# Patient Record
Sex: Female | Born: 1945 | Race: Black or African American | Hispanic: No | Smoking: Former smoker
Health system: Southern US, Community
[De-identification: ages and names within clinical notes are randomized; demographics above are authoritative.]

## PROBLEM LIST (undated history)

## (undated) DIAGNOSIS — K219 Gastro-esophageal reflux disease without esophagitis: Secondary | ICD-10-CM

## (undated) DIAGNOSIS — R0600 Dyspnea, unspecified: Secondary | ICD-10-CM

## (undated) DIAGNOSIS — I10 Essential (primary) hypertension: Secondary | ICD-10-CM

## (undated) DIAGNOSIS — J45909 Unspecified asthma, uncomplicated: Secondary | ICD-10-CM

## (undated) DIAGNOSIS — I2510 Atherosclerotic heart disease of native coronary artery without angina pectoris: Secondary | ICD-10-CM

## (undated) DIAGNOSIS — D509 Iron deficiency anemia, unspecified: Secondary | ICD-10-CM

## (undated) DIAGNOSIS — J449 Chronic obstructive pulmonary disease, unspecified: Secondary | ICD-10-CM

## (undated) DIAGNOSIS — M1990 Unspecified osteoarthritis, unspecified site: Secondary | ICD-10-CM

## (undated) DIAGNOSIS — G709 Myoneural disorder, unspecified: Secondary | ICD-10-CM

## (undated) DIAGNOSIS — E119 Type 2 diabetes mellitus without complications: Secondary | ICD-10-CM

## (undated) DIAGNOSIS — I509 Heart failure, unspecified: Secondary | ICD-10-CM

## (undated) DIAGNOSIS — K5900 Constipation, unspecified: Secondary | ICD-10-CM

## (undated) DIAGNOSIS — G4730 Sleep apnea, unspecified: Secondary | ICD-10-CM

## (undated) DIAGNOSIS — E7800 Pure hypercholesterolemia, unspecified: Secondary | ICD-10-CM

## (undated) DIAGNOSIS — D589 Hereditary hemolytic anemia, unspecified: Secondary | ICD-10-CM

## (undated) HISTORY — DX: Essential (primary) hypertension: I10

## (undated) HISTORY — DX: Pure hypercholesterolemia, unspecified: E78.00

## (undated) HISTORY — DX: Type 2 diabetes mellitus without complications: E11.9

## (undated) HISTORY — DX: Iron deficiency anemia, unspecified: D50.9

## (undated) HISTORY — PX: ECTOPIC PREGNANCY SURGERY: SHX613

## (undated) HISTORY — PX: PARTIAL HYSTERECTOMY: SHX80

## (undated) HISTORY — PX: CAROTID ENDARTERECTOMY: SUR193

## (undated) HISTORY — DX: Hereditary hemolytic anemia, unspecified: D58.9

## (undated) HISTORY — DX: Gastro-esophageal reflux disease without esophagitis: K21.9

## (undated) HISTORY — PX: OTHER SURGICAL HISTORY: SHX169

## (undated) HISTORY — DX: Sleep apnea, unspecified: G47.30

## (undated) HISTORY — DX: Constipation, unspecified: K59.00

## (undated) HISTORY — PX: BACK SURGERY: SHX140

---

## 2012-01-08 HISTORY — PX: COLONOSCOPY: SHX174

## 2014-07-05 ENCOUNTER — Encounter (INDEPENDENT_AMBULATORY_CARE_PROVIDER_SITE_OTHER): Payer: Self-pay | Admitting: *Deleted

## 2014-08-08 ENCOUNTER — Ambulatory Visit (INDEPENDENT_AMBULATORY_CARE_PROVIDER_SITE_OTHER): Payer: Self-pay | Admitting: Internal Medicine

## 2016-06-07 ENCOUNTER — Encounter: Payer: Self-pay | Admitting: Gastroenterology

## 2016-07-17 ENCOUNTER — Telehealth: Payer: Self-pay | Admitting: Gastroenterology

## 2016-07-17 ENCOUNTER — Ambulatory Visit (INDEPENDENT_AMBULATORY_CARE_PROVIDER_SITE_OTHER): Payer: Medicare HMO | Admitting: Gastroenterology

## 2016-07-17 ENCOUNTER — Encounter: Payer: Self-pay | Admitting: Gastroenterology

## 2016-07-17 ENCOUNTER — Other Ambulatory Visit: Payer: Self-pay

## 2016-07-17 DIAGNOSIS — D649 Anemia, unspecified: Secondary | ICD-10-CM

## 2016-07-17 MED ORDER — OMEPRAZOLE 20 MG PO CPDR: 20.0000 mg | DELAYED_RELEASE_CAPSULE | Freq: Every day | ORAL | 3 refills | Status: DC

## 2016-07-17 MED ORDER — NA SULFATE-K SULFATE-MG SULF 17.5-3.13-1.6 GM/177ML PO SOLN: 1.0000 | ORAL | 0 refills | Status: DC

## 2016-07-17 NOTE — Patient Instructions (Addendum)
I have sent Prilosec to the pharmacy to take once each morning, 30 minutes before breakfast.  We have scheduled you for a colonoscopy and upper endoscopy with Dr. Oneida Alar in the near future.  Please have blood work done tomorrow.

## 2016-07-17 NOTE — Telephone Encounter (Signed)
Noted  

## 2016-07-17 NOTE — Progress Notes (Addendum)
REVIEWED-NO ADDITIONAL RECOMMENDATIONS.  Primary Care Physician:  Keane Police, MD Primary Gastroenterologist:  Dr. Oneida Alar   Chief Complaint  Patient presents with  . Anemia    HPI:   Christine Bolton is a 71 y.o. female presenting today at the request of Dr. Junius Roads, hematologist, due to anemia and heme positive stool. From review of records, Hgb in March 2018 was 9, microcytic anemia. History of autoimmune hemolytic anemia and followed by Dr. Junius Roads. Received iron infusion in November 2017. She was heme positive recently.  Has bone marrow biopsy upcoming due to anemia.   Takes iron pill and states stool is always dark. Feels like hemorrhoids are an issue when having a BM. Feels constipated. Takes Linzess samples but unsure dosage. Chronic GERD but not on a PPI. Taking Zantac but no PPI. No NSAIDs. Aleve and Advil "makes me feel funny". Tramadol helps with joint pain/hip pain. No N/V. No unexplained weight loss. Comes off of antibiotics and cough resumes. Last iron infusion 3-4 months ago. States insurance won't pay for [employer].   Colonoscopy/EGD by Dr. Algis Greenhouse in 2014 but requesting records. Appears there is some concern for possible celiac disease in the remote past.   Past Medical History:  Diagnosis Date  . Constipation   . Diabetes ([place])   . GERD (gastroesophageal reflux disease)   . Hemolytic anemia ([place])   . HTN (hypertension)   . Hypercholesterolemia   . IDA (iron deficiency anemia)   . Sleep apnea     Past Surgical History:  Procedure Laterality Date  . BACK SURGERY    . CAROTID ENDARTERECTOMY    . COLONOSCOPY  2014   normal (?) she is not sure  . ECTOPIC PREGNANCY SURGERY    . fibroid tumor    . lower extremity bypass    . PARTIAL HYSTERECTOMY      Current Outpatient Prescriptions  Medication Sig Dispense Refill  . alendronate (FOSAMAX) 70 MG tablet   2  . amLODipine (NORVASC) 10 MG tablet Take 1 tablet by mouth daily.    Marland Kitchen aspirin 81 MG chewable tablet  Take 81 mg by mouth daily.      Marland Kitchen atorvastatin (LIPITOR) 40 MG tablet Take 1 tablet by mouth daily.    Marland Kitchen atorvastatin (LIPITOR) 40 MG tablet TK 1 T PO HS  3  . CARAFATE 1 GM/10ML suspension SHAKE LQ AND TK 10 ML PO BID QD AC AND HS  3  . carvedilol (COREG) 25 MG tablet Take 1 tablet by mouth 2 times daily (with meals).    . folic acid (FOLVITE) 1 MG tablet Take 1 tablet by mouth daily.    . INCRUSE ELLIPTA 62.5 MCG/INH AEPB INHALE 1 PUFF PO QD  3  . ipratropium (ATROVENT) 0.06 % nasal spray SPRAY 1 SPRAY IN BOTH NOSTRILS Q 4 HOURS PRN  2  . losartan (COZAAR) 50 MG tablet Take 1 tablet by mouth daily.    Marland Kitchen MAGNESIUM-OXIDE 400 (241.3 Mg) MG tablet TK 1 T PO BID  3  . metFORMIN (GLUCOPHAGE) 500 MG tablet Take 500 mg by mouth daily (with breakfast). Take with meal     . montelukast (SINGULAIR) 10 MG tablet TK 1 T PO QD IN THE EVE  5  . naproxen (NAPROSYN) 500 MG tablet TK 1 T PO Q 12 H X 10 DAYS  0  . predniSONE (DELTASONE) 20 MG tablet Take 1 tablet by mouth daily. 88m (3 tabs) daily. 4/12, 467m(2 tabs) one week. 4/19 20  mg (1 tab) one week. 4/26, 10mg (1/2 tab) one week    . spironolactone (ALDACTONE) 25 MG tablet TK 1 T PO  QD  2  . tiZANidine (ZANAFLEX) 2 MG tablet Take 2 mg by mouth every 6 hours as needed.      . traMADol (ULTRAM) 50 MG tablet Take 50 mg by mouth every 6 hours as needed.       No current facility-administered medications for this visit.     Allergies as of 07/17/2016 - Review Complete 07/17/2016  Allergen Reaction Noted  . Levofloxacin Other (See Comments) 04/12/2011    Family History  Problem Relation Age of Onset  . Colon cancer Neg Hx        no first degree relatives. States cousin had colon cancer  . Colon polyps Neg Hx     Social History   Social History  . Marital status: Divorced    Spouse name: N/A  . Number of children: N/A  . Years of education: N/A   Occupational History  . Not on file.   Social History Main Topics  . Smoking status: Never  Smoker  . Smokeless tobacco: Never Used  . Alcohol use No  . Drug use: No  . Sexual activity: Not on file   Other Topics Concern  . Not on file   Social History Narrative  . No narrative on file    Review of Systems: Gen: Denies any fever, chills, fatigue, weight loss, lack of appetite.  CV: Denies chest pain, heart palpitations, peripheral edema, syncope.  Resp: +DOE GI: see HPI  GU : Denies urinary burning, urinary frequency, urinary hesitancy MS: see HPI  Derm: Denies rash, itching, dry skin Psych: Denies depression, anxiety, memory loss, and confusion Heme: see HPI   Physical Exam: BP (!) 143/73   Pulse 67   Temp 98.4 F (36.9 C) (Oral)   Ht 5' 3" (1.6 m)   Wt 188 lb (85.3 kg)   BMI 33.30 kg/m  General:   Alert and oriented. Pleasant and cooperative. Well-nourished and well-developed.  Head:  Normocephalic and atraumatic. Eyes:  Without icterus, sclera clear and conjunctiva pink.  Ears:  Normal auditory acuity. Nose:  No deformity, discharge,  or lesions. Mouth:  No deformity or lesions, oral mucosa pink.  Lungs:  Clear to auscultation bilaterally. No wheezes, rales, or rhonchi. No distress.  Heart:  S1, S2 present  Abdomen:  +BS, soft, non-tender and non-distended. No HSM noted. No guarding or rebound. No masses appreciated.  Rectal:  Deferred  Msk:  Symmetrical without gross deformities. Normal posture. Extremities:  Without edema. Neurologic:  Alert and  oriented x4;  grossly normal neurologically. Psych:  Alert and cooperative. Normal mood and affect.   

## 2016-07-17 NOTE — Telephone Encounter (Signed)
Patient has [facility] that requires a referral and I called and spoke to Seth Bake at the PCP office and she stated they do them by fax and would have the approval to [place] tomorrow

## 2016-07-18 ENCOUNTER — Telehealth: Payer: Self-pay

## 2016-07-18 NOTE — Telephone Encounter (Signed)
Tried to call pt, LMOAM and informed her of pre-op appt 08/08/16 at 9:00am. Letter mailed.

## 2016-07-18 NOTE — Assessment & Plan Note (Signed)
71 year old female followed closely by Hematology with [facility] with history of autoimmune hemolytic anemia, IDA, receiving iron infusions several months ago, heme positive. No overt GI bleeding. Some question of celiac disease historically, but patient is unsure of this. Last colonoscopy/EGD in 2014 by Dr. Algis Greenhouse. Requesting these records. Hgb normally in 10 range per notes but drifting recently to the 9 range.  Check celiac serologies Proceed with colonoscopy/EGD with Dr. Oneida Alar in the near future. The risks, benefits, and alternatives have been discussed in detail with the patient. They state understanding and desire to proceed.  PROPOFOL due to polypharmacy Start Prilosec once daily due to GERD Continue avoidance of NSAIDs

## 2016-07-22 NOTE — Progress Notes (Signed)
CC'D TO PCP °

## 2016-07-25 NOTE — Telephone Encounter (Signed)
[employer] approval scanned

## 2016-08-05 NOTE — Patient Instructions (Signed)
Christine Bolton  08/05/2016     @PREFPERIOPPHARMACY @   Your procedure is scheduled on  08/13/2016   Report to [facility] at  745   A.M.  Call this number if you have problems the morning of surgery:  [phone]   Remember:  Do not eat food or drink liquids after midnight.  Take these medicines the morning of surgery with A SIP OF WATER  Amlodipine, coreg, cozaar, prilosec, zanaflex or ultram. Use your inhaler before you come. DO NOT take any medication for diabetes the morning of your procedure.   Do not wear jewelry, make-up or nail polish.  Do not wear lotions, powders, or perfumes, or deoderant.  Do not shave 48 hours prior to surgery.  Men may shave face and neck.  Do not bring valuables to the hospital.  [facility] is not responsible for any belongings or valuables.  Contacts, dentures or bridgework may not be worn into surgery.  Leave your suitcase in the car.  After surgery it may be brought to your room.  For patients admitted to the hospital, discharge time will be determined by your treatment team.  Patients discharged the day of surgery will not be allowed to drive home.   Name and phone number of your driver:   family Special instructions:  Follow the diet and prep instructions given to you by Dr Nona Dell office.  Please read over the following fact sheets that you were given. Anesthesia Post-op Instructions and Care and Recovery After Surgery       Esophagogastroduodenoscopy Esophagogastroduodenoscopy (EGD) is a procedure to examine the lining of the esophagus, stomach, and first part of the small intestine (duodenum). This procedure is done to check for problems such as inflammation, bleeding, ulcers, or growths. During this procedure, a long, flexible, lighted tube with a camera attached (endoscope) is inserted down the throat. Tell a health care provider about:  Any allergies you have.  All medicines you are taking, including  vitamins, herbs, eye drops, creams, and over-the-counter medicines.  Any problems you or family members have had with anesthetic medicines.  Any blood disorders you have.  Any surgeries you have had.  Any medical conditions you have.  Whether you are pregnant or may be pregnant. What are the risks? Generally, this is a safe procedure. However, problems may occur, including:  Infection.  Bleeding.  A tear (perforation) in the esophagus, stomach, or duodenum.  Trouble breathing.  Excessive sweating.  Spasms of the larynx.  A slowed heartbeat.  Low blood pressure.  What happens before the procedure?  Follow instructions from your health care provider about eating or drinking restrictions.  Ask your health care provider about: ? Changing or stopping your regular medicines. This is especially important if you are taking diabetes medicines or blood thinners. ? Taking medicines such as aspirin and ibuprofen. These medicines can thin your blood. Do not take these medicines before your procedure if your health care provider instructs you not to.  Plan to have someone take you home after the procedure.  If you wear dentures, be ready to remove them before the procedure. What happens during the procedure?  To reduce your risk of infection, your health care team will wash or sanitize their hands.  An IV tube will be put in a vein in your hand or arm. You will get medicines and fluids through this tube.  You will be given one  or more of the following: ? A medicine to help you relax (sedative). ? A medicine to numb the area (local anesthetic). This medicine may be sprayed into your throat. It will make you feel more comfortable and keep you from gagging or coughing during the procedure. ? A medicine for pain.  A mouth guard may be placed in your mouth to protect your teeth and to keep you from biting on the endoscope.  You will be asked to lie on your left side.  The  endoscope will be lowered down your throat into your esophagus, stomach, and duodenum.  Air will be put into the endoscope. This will help your health care provider see better.  The lining of your esophagus, stomach, and duodenum will be examined.  Your health care provider may: ? Take a tissue sample so it can be looked at in a lab (biopsy). ? Remove growths. ? Remove objects (foreign bodies) that are stuck. ? Treat any bleeding with medicines or other devices that stop tissue from bleeding. ? Widen (dilate) or stretch narrowed areas of your esophagus and stomach.  The endoscope will be taken out. The procedure may vary among health care providers and hospitals. What happens after the procedure?  Your blood pressure, heart rate, breathing rate, and blood oxygen level will be monitored often until the medicines you were given have worn off.  Do not eat or drink anything until the numbing medicine has worn off and your gag reflex has returned. This information is not intended to replace advice given to you by your health care provider. Make sure you discuss any questions you have with your health care provider. Document Released: 04/26/2004 Document Revised: 06/01/2015 Document Reviewed: 11/17/2014 Elsevier Interactive Patient Education  2018 [employer]. Esophagogastroduodenoscopy, Care After Refer to this sheet in the next few weeks. These instructions provide you with information about caring for yourself after your procedure. Your health care provider may also give you more specific instructions. Your treatment has been planned according to current medical practices, but problems sometimes occur. Call your health care provider if you have any problems or questions after your procedure. What can I expect after the procedure? After the procedure, it is common to have:  A sore throat.  Nausea.  Bloating.  Dizziness.  Fatigue.  Follow these instructions at home:  Do not eat  or drink anything until the numbing medicine (local anesthetic) has worn off and your gag reflex has returned. You will know that the local anesthetic has worn off when you can swallow comfortably.  Do not drive for 24 hours if you received a medicine to help you relax (sedative).  If your health care provider took a tissue sample for testing during the procedure, make sure to get your test results. This is your responsibility. Ask your health care provider or the department performing the test when your results will be ready.  Keep all follow-up visits as told by your health care provider. This is important. Contact a health care provider if:  You cannot stop coughing.  You are not urinating.  You are urinating less than usual. Get help right away if:  You have trouble swallowing.  You cannot eat or drink.  You have throat or chest pain that gets worse.  You are dizzy or light-headed.  You faint.  You have nausea or vomiting.  You have chills.  You have a fever.  You have severe abdominal pain.  You have black, tarry, or bloody stools.  This information is not intended to replace advice given to you by your health care provider. Make sure you discuss any questions you have with your health care provider. Document Released: 12/11/2011 Document Revised: 06/01/2015 Document Reviewed: 11/17/2014 Elsevier Interactive Patient Education  2018 [employer].  Colonoscopy, Adult A colonoscopy is an exam to look at the entire large intestine. During the exam, a lubricated, bendable tube is inserted into the anus and then passed into the rectum, colon, and other parts of the large intestine. A colonoscopy is often done as a part of normal colorectal screening or in response to certain symptoms, such as anemia, persistent diarrhea, abdominal pain, and blood in the stool. The exam can help screen for and diagnose medical problems, including:  Tumors.  Polyps.  Inflammation.  Areas  of bleeding.  Tell a health care provider about:  Any allergies you have.  All medicines you are taking, including vitamins, herbs, eye drops, creams, and over-the-counter medicines.  Any problems you or family members have had with anesthetic medicines.  Any blood disorders you have.  Any surgeries you have had.  Any medical conditions you have.  Any problems you have had passing stool. What are the risks? Generally, this is a safe procedure. However, problems may occur, including:  Bleeding.  A tear in the intestine.  A reaction to medicines given during the exam.  Infection (rare).  What happens before the procedure? Eating and drinking restrictions Follow instructions from your health care provider about eating and drinking, which may include:  A few days before the procedure - follow a low-fiber diet. Avoid nuts, seeds, dried fruit, raw fruits, and vegetables.  1-3 days before the procedure - follow a clear liquid diet. Drink only clear liquids, such as clear broth or bouillon, black coffee or tea, clear juice, clear soft drinks or sports drinks, gelatin dessert, and popsicles. Avoid any liquids that contain red or purple dye.  On the day of the procedure - do not eat or drink anything during the 2 hours before the procedure, or within the time period that your health care provider recommends.  Bowel prep If you were prescribed an oral bowel prep to clean out your colon:  Take it as told by your health care provider. Starting the day before your procedure, you will need to drink a large amount of medicated liquid. The liquid will cause you to have multiple loose stools until your stool is almost clear or light green.  If your skin or anus gets irritated from diarrhea, you may use these to relieve the irritation: ? Medicated wipes, such as adult wet wipes with aloe and vitamin E. ? A skin soothing-product like petroleum jelly.  If you vomit while drinking the bowel  prep, take a break for up to 60 minutes and then begin the bowel prep again. If vomiting continues and you cannot take the bowel prep without vomiting, call your health care provider.  General instructions  Ask your health care provider about changing or stopping your regular medicines. This is especially important if you are taking diabetes medicines or blood thinners.  Plan to have someone take you home from the hospital or clinic. What happens during the procedure?  An IV tube may be inserted into one of your veins.  You will be given medicine to help you relax (sedative).  To reduce your risk of infection: ? Your health care team will wash or sanitize their hands. ? Your anal area will be washed  with soap.  You will be asked to lie on your side with your knees bent.  Your health care provider will lubricate a long, thin, flexible tube. The tube will have a camera and a light on the end.  The tube will be inserted into your anus.  The tube will be gently eased through your rectum and colon.  Air will be delivered into your colon to keep it open. You may feel some pressure or cramping.  The camera will be used to take images during the procedure.  A small tissue sample may be removed from your body to be examined under a microscope (biopsy). If any potential problems are found, the tissue will be sent to a lab for testing.  If small polyps are found, your health care provider may remove them and have them checked for cancer cells.  The tube that was inserted into your anus will be slowly removed. The procedure may vary among health care providers and hospitals. What happens after the procedure?  Your blood pressure, heart rate, breathing rate, and blood oxygen level will be monitored until the medicines you were given have worn off.  Do not drive for 24 hours after the exam.  You may have a small amount of blood in your stool.  You may pass gas and have mild abdominal  cramping or bloating due to the air that was used to inflate your colon during the exam.  It is up to you to get the results of your procedure. Ask your health care provider, or the department performing the procedure, when your results will be ready. This information is not intended to replace advice given to you by your health care provider. Make sure you discuss any questions you have with your health care provider. Document Released: 12/22/1999 Document Revised: 10/25/2015 Document Reviewed: 03/07/2015 Elsevier Interactive Patient Education  2018 [employer].  Colonoscopy, Adult, Care After This sheet gives you information about how to care for yourself after your procedure. Your health care provider may also give you more specific instructions. If you have problems or questions, contact your health care provider. What can I expect after the procedure? After the procedure, it is common to have:  A small amount of blood in your stool for 24 hours after the procedure.  Some gas.  Mild abdominal cramping or bloating.  Follow these instructions at home: General instructions   For the first 24 hours after the procedure: ? Do not drive or use machinery. ? Do not sign important documents. ? Do not drink alcohol. ? Do your regular daily activities at a slower pace than normal. ? Eat soft, easy-to-digest foods. ? Rest often.  Take over-the-counter or prescription medicines only as told by your health care provider.  It is up to you to get the results of your procedure. Ask your health care provider, or the department performing the procedure, when your results will be ready. Relieving cramping and bloating  Try walking around when you have cramps or feel bloated.  Apply heat to your abdomen as told by your health care provider. Use a heat source that your health care provider recommends, such as a moist heat pack or a heating pad. ? Place a towel between your skin and the heat  source. ? Leave the heat on for 20-30 minutes. ? Remove the heat if your skin turns bright red. This is especially important if you are unable to feel pain, heat, or cold. You may have a greater  risk of getting burned. Eating and drinking  Drink enough fluid to keep your urine clear or pale yellow.  Resume your normal diet as instructed by your health care provider. Avoid heavy or fried foods that are hard to digest.  Avoid drinking alcohol for as long as instructed by your health care provider. Contact a health care provider if:  You have blood in your stool 2-3 days after the procedure. Get help right away if:  You have more than a small spotting of blood in your stool.  You pass large blood clots in your stool.  Your abdomen is swollen.  You have nausea or vomiting.  You have a fever.  You have increasing abdominal pain that is not relieved with medicine. This information is not intended to replace advice given to you by your health care provider. Make sure you discuss any questions you have with your health care provider. Document Released: 08/08/2003 Document Revised: 09/18/2015 Document Reviewed: 03/07/2015 Elsevier Interactive Patient Education  2018 [place] Anesthesia is a term that refers to techniques, procedures, and medicines that help a person stay safe and comfortable during a medical procedure. Monitored anesthesia care, or sedation, is one type of anesthesia. Your anesthesia specialist may recommend sedation if you will be having a procedure that does not require you to be unconscious, such as:  Cataract surgery.  A dental procedure.  A biopsy.  A colonoscopy.  During the procedure, you may receive a medicine to help you relax (sedative). There are three levels of sedation:  Mild sedation. At this level, you may feel awake and relaxed. You will be able to follow directions.  Moderate sedation. At this level, you will be  sleepy. You may not remember the procedure.  Deep sedation. At this level, you will be asleep. You will not remember the procedure.  The more medicine you are given, the deeper your level of sedation will be. Depending on how you respond to the procedure, the anesthesia specialist may change your level of sedation or the type of anesthesia to fit your needs. An anesthesia specialist will monitor you closely during the procedure. Let your health care provider know about:  Any allergies you have.  All medicines you are taking, including vitamins, herbs, eye drops, creams, and over-the-counter medicines.  Any use of steroids (by mouth or as a cream).  Any problems you or family members have had with sedatives and anesthetic medicines.  Any blood disorders you have.  Any surgeries you have had.  Any medical conditions you have, such as sleep apnea.  Whether you are pregnant or may be pregnant.  Any use of cigarettes, alcohol, or street drugs. What are the risks? Generally, this is a safe procedure. However, problems may occur, including:  Getting too much medicine (oversedation).  Nausea.  Allergic reaction to medicines.  Trouble breathing. If this happens, a breathing tube may be used to help with breathing. It will be removed when you are awake and breathing on your own.  Heart trouble.  Lung trouble.  Before the procedure Staying hydrated Follow instructions from your health care provider about hydration, which may include:  Up to 2 hours before the procedure - you may continue to drink clear liquids, such as water, clear fruit juice, black coffee, and plain tea.  Eating and drinking restrictions Follow instructions from your health care provider about eating and drinking, which may include:  8 hours before the procedure - stop eating heavy meals  or foods such as meat, fried foods, or fatty foods.  6 hours before the procedure - stop eating light meals or foods, such  as toast or cereal.  6 hours before the procedure - stop drinking milk or drinks that contain milk.  2 hours before the procedure - stop drinking clear liquids.  Medicines Ask your health care provider about:  Changing or stopping your regular medicines. This is especially important if you are taking diabetes medicines or blood thinners.  Taking medicines such as aspirin and ibuprofen. These medicines can thin your blood. Do not take these medicines before your procedure if your health care provider instructs you not to.  Tests and exams  You will have a physical exam.  You may have blood tests done to show: ? How well your kidneys and liver are working. ? How well your blood can clot.  General instructions  Plan to have someone take you home from the hospital or clinic.  If you will be going home right after the procedure, plan to have someone with you for 24 hours.  What happens during the procedure?  Your blood pressure, heart rate, breathing, level of pain and overall condition will be monitored.  An IV tube will be inserted into one of your veins.  Your anesthesia specialist will give you medicines as needed to keep you comfortable during the procedure. This may mean changing the level of sedation.  The procedure will be performed. After the procedure  Your blood pressure, heart rate, breathing rate, and blood oxygen level will be monitored until the medicines you were given have worn off.  Do not drive for 24 hours if you received a sedative.  You may: ? Feel sleepy, clumsy, or nauseous. ? Feel forgetful about what happened after the procedure. ? Have a sore throat if you had a breathing tube during the procedure. ? Vomit. This information is not intended to replace advice given to you by your health care provider. Make sure you discuss any questions you have with your health care provider. Document Released: 09/19/2004 Document Revised: 06/02/2015 Document  Reviewed: 04/16/2015 Elsevier Interactive Patient Education  2018 [place], Care After These instructions provide you with information about caring for yourself after your procedure. Your health care provider may also give you more specific instructions. Your treatment has been planned according to current medical practices, but problems sometimes occur. Call your health care provider if you have any problems or questions after your procedure. What can I expect after the procedure? After your procedure, it is common to:  Feel sleepy for several hours.  Feel clumsy and have poor balance for several hours.  Feel forgetful about what happened after the procedure.  Have poor judgment for several hours.  Feel nauseous or vomit.  Have a sore throat if you had a breathing tube during the procedure.  Follow these instructions at home: For at least 24 hours after the procedure:   Do not: ? Participate in activities in which you could fall or become injured. ? Drive. ? Use heavy machinery. ? Drink alcohol. ? Take sleeping pills or medicines that cause drowsiness. ? Make important decisions or sign legal documents. ? Take care of children on your own.  Rest. Eating and drinking  Follow the diet that is recommended by your health care provider.  If you vomit, drink water, juice, or soup when you can drink without vomiting.  Make sure you have little or no nausea before  eating solid foods. General instructions  Have a responsible adult stay with you until you are awake and alert.  Take over-the-counter and prescription medicines only as told by your health care provider.  If you smoke, do not smoke without supervision.  Keep all follow-up visits as told by your health care provider. This is important. Contact a health care provider if:  You keep feeling nauseous or you keep vomiting.  You feel light-headed.  You develop a rash.  You have a  fever. Get help right away if:  You have trouble breathing. This information is not intended to replace advice given to you by your health care provider. Make sure you discuss any questions you have with your health care provider. Document Released: 04/16/2015 Document Revised: 08/16/2015 Document Reviewed: 04/16/2015 Elsevier Interactive Patient Education  [employer].

## 2016-08-08 ENCOUNTER — Encounter (HOSPITAL_COMMUNITY): Payer: Self-pay

## 2016-08-08 ENCOUNTER — Encounter (HOSPITAL_COMMUNITY)
Admission: RE | Admit: 2016-08-08 | Discharge: 2016-08-08 | Disposition: A | Discharge status: Home / Self Care | Payer: Medicare HMO | Source: Ambulatory Visit | Attending: Gastroenterology | Admitting: Gastroenterology

## 2016-08-08 DIAGNOSIS — Z01818 Encounter for other preprocedural examination: Secondary | ICD-10-CM | POA: Diagnosis not present

## 2016-08-08 HISTORY — DX: Myoneural disorder, unspecified: G70.9

## 2016-08-08 HISTORY — DX: Dyspnea, unspecified: R06.00

## 2016-08-08 HISTORY — DX: Unspecified osteoarthritis, unspecified site: M19.90

## 2016-08-08 HISTORY — DX: Unspecified asthma, uncomplicated: J45.909

## 2016-08-08 LAB — CBC WITH DIFFERENTIAL/PLATELET
Basophils Absolute: 0 10*3/uL (ref 0.0–0.1)
Basophils Relative: 0 %
EOS PCT: 12 %
Eosinophils Absolute: 0.6 10*3/uL (ref 0.0–0.7)
HCT: 32.6 % — ABNORMAL LOW (ref 36.0–46.0)
HEMOGLOBIN: 9.7 g/dL — AB (ref 12.0–15.0)
LYMPHS ABS: 1 10*3/uL (ref 0.7–4.0)
LYMPHS PCT: 18 %
MCH: 23.7 pg — AB (ref 26.0–34.0)
MCHC: 29.8 g/dL — ABNORMAL LOW (ref 30.0–36.0)
MCV: 79.5 fL (ref 78.0–100.0)
Monocytes Absolute: 0.5 10*3/uL (ref 0.1–1.0)
Monocytes Relative: 10 %
Neutro Abs: 3.4 10*3/uL (ref 1.7–7.7)
Neutrophils Relative %: 60 %
PLATELETS: 200 10*3/uL (ref 150–400)
RBC: 4.1 MIL/uL (ref 3.87–5.11)
RDW: 19.5 % — ABNORMAL HIGH (ref 11.5–15.5)
WBC: 5.6 10*3/uL (ref 4.0–10.5)

## 2016-08-08 LAB — BASIC METABOLIC PANEL
Anion gap: 9 (ref 5–15)
BUN: 15 mg/dL (ref 6–20)
CHLORIDE: 102 mmol/L (ref 101–111)
CO2: 26 mmol/L (ref 22–32)
Calcium: 9.6 mg/dL (ref 8.9–10.3)
Creatinine, Ser: 0.88 mg/dL (ref 0.44–1.00)
GFR calc Af Amer: >60 mL/min (ref >60)
GFR calc non Af Amer: >60 mL/min (ref >60)
GLUCOSE: 119 mg/dL — AB (ref 65–99)
POTASSIUM: 4.3 mmol/L (ref 3.5–5.1)
Sodium: 137 mmol/L (ref 135–145)

## 2016-08-08 NOTE — Progress Notes (Signed)
To Dr. Fields. 

## 2016-08-08 NOTE — Progress Notes (Signed)
PT is aware. When does she recheck?

## 2016-08-08 NOTE — Progress Notes (Signed)
Per Dr. 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 She can see Hematologist with questions about following up on Hemoglobin.  I called and left Vm for pt and gave her the message and told her to call if she has questions.

## 2016-08-13 ENCOUNTER — Encounter (HOSPITAL_COMMUNITY)
Admission: RE | Disposition: A | Discharge status: Home / Self Care | Payer: Self-pay | Source: Ambulatory Visit | Attending: Gastroenterology

## 2016-08-13 ENCOUNTER — Ambulatory Visit (HOSPITAL_COMMUNITY): Payer: Medicare HMO | Admitting: Anesthesiology

## 2016-08-13 ENCOUNTER — Ambulatory Visit (HOSPITAL_COMMUNITY)
Admission: RE | Admit: 2016-08-13 | Discharge: 2016-08-13 | Disposition: A | Discharge status: Home / Self Care | Payer: Medicare HMO | Source: Ambulatory Visit | Attending: Gastroenterology | Admitting: Gastroenterology

## 2016-08-13 ENCOUNTER — Encounter (HOSPITAL_COMMUNITY): Payer: Self-pay | Admitting: Gastroenterology

## 2016-08-13 DIAGNOSIS — K2950 Unspecified chronic gastritis without bleeding: Secondary | ICD-10-CM | POA: Diagnosis not present

## 2016-08-13 DIAGNOSIS — Z7984 Long term (current) use of oral hypoglycemic drugs: Secondary | ICD-10-CM | POA: Diagnosis not present

## 2016-08-13 DIAGNOSIS — K2970 Gastritis, unspecified, without bleeding: Secondary | ICD-10-CM | POA: Diagnosis not present

## 2016-08-13 DIAGNOSIS — K644 Residual hemorrhoidal skin tags: Secondary | ICD-10-CM | POA: Insufficient documentation

## 2016-08-13 DIAGNOSIS — K648 Other hemorrhoids: Secondary | ICD-10-CM | POA: Insufficient documentation

## 2016-08-13 DIAGNOSIS — Z7982 Long term (current) use of aspirin: Secondary | ICD-10-CM | POA: Diagnosis not present

## 2016-08-13 DIAGNOSIS — Q438 Other specified congenital malformations of intestine: Secondary | ICD-10-CM | POA: Diagnosis not present

## 2016-08-13 DIAGNOSIS — K2980 Duodenitis without bleeding: Secondary | ICD-10-CM | POA: Insufficient documentation

## 2016-08-13 DIAGNOSIS — K219 Gastro-esophageal reflux disease without esophagitis: Secondary | ICD-10-CM | POA: Diagnosis not present

## 2016-08-13 DIAGNOSIS — E119 Type 2 diabetes mellitus without complications: Secondary | ICD-10-CM | POA: Diagnosis not present

## 2016-08-13 DIAGNOSIS — G4730 Sleep apnea, unspecified: Secondary | ICD-10-CM | POA: Diagnosis not present

## 2016-08-13 DIAGNOSIS — I10 Essential (primary) hypertension: Secondary | ICD-10-CM | POA: Diagnosis not present

## 2016-08-13 DIAGNOSIS — D649 Anemia, unspecified: Secondary | ICD-10-CM

## 2016-08-13 DIAGNOSIS — M1990 Unspecified osteoarthritis, unspecified site: Secondary | ICD-10-CM | POA: Diagnosis not present

## 2016-08-13 DIAGNOSIS — Z79899 Other long term (current) drug therapy: Secondary | ICD-10-CM | POA: Diagnosis not present

## 2016-08-13 DIAGNOSIS — D591 Other autoimmune hemolytic anemias: Secondary | ICD-10-CM | POA: Diagnosis present

## 2016-08-13 DIAGNOSIS — E7800 Pure hypercholesterolemia, unspecified: Secondary | ICD-10-CM | POA: Insufficient documentation

## 2016-08-13 DIAGNOSIS — K222 Esophageal obstruction: Secondary | ICD-10-CM | POA: Insufficient documentation

## 2016-08-13 DIAGNOSIS — K449 Diaphragmatic hernia without obstruction or gangrene: Secondary | ICD-10-CM | POA: Diagnosis not present

## 2016-08-13 DIAGNOSIS — Z881 Allergy status to other antibiotic agents status: Secondary | ICD-10-CM | POA: Insufficient documentation

## 2016-08-13 DIAGNOSIS — J45909 Unspecified asthma, uncomplicated: Secondary | ICD-10-CM | POA: Insufficient documentation

## 2016-08-13 DIAGNOSIS — D123 Benign neoplasm of transverse colon: Secondary | ICD-10-CM | POA: Diagnosis not present

## 2016-08-13 DIAGNOSIS — Z87891 Personal history of nicotine dependence: Secondary | ICD-10-CM | POA: Insufficient documentation

## 2016-08-13 DIAGNOSIS — K5730 Diverticulosis of large intestine without perforation or abscess without bleeding: Secondary | ICD-10-CM | POA: Diagnosis not present

## 2016-08-13 HISTORY — PX: ESOPHAGOGASTRODUODENOSCOPY (EGD) WITH PROPOFOL: SHX5813

## 2016-08-13 HISTORY — PX: BIOPSY: SHX5522

## 2016-08-13 HISTORY — PX: POLYPECTOMY: SHX5525

## 2016-08-13 HISTORY — PX: COLONOSCOPY WITH PROPOFOL: SHX5780

## 2016-08-13 LAB — GLUCOSE, CAPILLARY
Glucose-Capillary: 129 mg/dL — ABNORMAL HIGH (ref 65–99)
Glucose-Capillary: 117 mg/dL — ABNORMAL HIGH (ref 65–99)

## 2016-08-13 SURGERY — COLONOSCOPY WITH PROPOFOL
Anesthesia: Monitor Anesthesia Care

## 2016-08-13 MED ORDER — LIDOCAINE VISCOUS 2 % MT SOLN
OROMUCOSAL | Status: AC
  Filled 2016-08-13: qty 15

## 2016-08-13 MED ORDER — MIDAZOLAM HCL 2 MG/2ML IJ SOLN
1.0000 mg | INTRAMUSCULAR | Status: AC
  Administered 2016-08-13: 2 mg via INTRAVENOUS

## 2016-08-13 MED ORDER — LIDOCAINE VISCOUS 2 % MT SOLN
5.0000 mL | Freq: Once | OROMUCOSAL | Status: AC
  Administered 2016-08-13: 5 mL via OROMUCOSAL

## 2016-08-13 MED ORDER — MINERAL OIL PO OIL
TOPICAL_OIL | ORAL | Status: AC
  Filled 2016-08-13: qty 30

## 2016-08-13 MED ORDER — LIDOCAINE HCL 2 % EX GEL
CUTANEOUS | Status: AC
  Filled 2016-08-13: qty 30

## 2016-08-13 MED ORDER — LACTATED RINGERS IV SOLN
INTRAVENOUS | Status: DC
  Administered 2016-08-13: 08:00:00 via INTRAVENOUS

## 2016-08-13 MED ORDER — MIDAZOLAM HCL 2 MG/2ML IJ SOLN
INTRAMUSCULAR | Status: AC
  Filled 2016-08-13: qty 2

## 2016-08-13 MED ORDER — CHLORHEXIDINE GLUCONATE CLOTH 2 % EX PADS: 6.0000 | MEDICATED_PAD | Freq: Once | CUTANEOUS | Status: DC

## 2016-08-13 MED ORDER — PROPOFOL 10 MG/ML IV BOLUS
INTRAVENOUS | Status: AC
  Filled 2016-08-13: qty 40

## 2016-08-13 MED ORDER — LIDOCAINE HCL (CARDIAC) 10 MG/ML IV SOLN
INTRAVENOUS | Status: DC | PRN
  Administered 2016-08-13: 40 mg via INTRAVENOUS

## 2016-08-13 MED ORDER — FENTANYL CITRATE (PF) 100 MCG/2ML IJ SOLN
25.0000 ug | Freq: Once | INTRAMUSCULAR | Status: AC
  Administered 2016-08-13: 25 ug via INTRAVENOUS

## 2016-08-13 MED ORDER — PROPOFOL 500 MG/50ML IV EMUL
INTRAVENOUS | Status: DC | PRN
  Administered 2016-08-13: 10:00:00 via INTRAVENOUS
  Administered 2016-08-13: 150 ug/kg/min via INTRAVENOUS
  Administered 2016-08-13: 10:00:00 via INTRAVENOUS

## 2016-08-13 MED ORDER — FENTANYL CITRATE (PF) 100 MCG/2ML IJ SOLN
INTRAMUSCULAR | Status: AC
  Filled 2016-08-13: qty 2

## 2016-08-13 NOTE — Anesthesia Procedure Notes (Signed)
Procedure Name: MAC Date/Time: 08/13/2016 9:30 AM Performed by: Andree Elk, Karmine Kauer A Pre-anesthesia Checklist: Patient identified, Emergency Drugs available, Suction available, Patient being monitored and Timeout performed Oxygen Delivery Method: Simple face mask

## 2016-08-13 NOTE — Op Note (Signed)
[facility] Patient Name: Christine Bolton Procedure Date: 08/13/2016 9:28 AM MRN: [id number] Date of Birth: 1945-05-05 Attending MD: Barney Drain , MD CSN: [id number] Age: 71 Admit Type: Outpatient Procedure:                Colonoscopy WITH COLD SNARE POLYPECTOMY Indications:              Anemia. LAST pRBCs > 1 YR. LAST IVFE 2018. PMHx:                            AUTOIMMUNE HEMOLYTIC ANEMIA. NO BRBPR OR MELENA.                            LAST EGD/TCS 2014. Providers:                Barney Drain, MD, Jeanann Lewandowsky. Sharon Seller, RN, Aram Candela Referring MD:             Keane Police, MD Medicines:                Propofol per Anesthesia Complications:            No immediate complications. Estimated Blood Loss:     Estimated blood loss was minimal. Procedure:                Pre-Anesthesia Assessment:                           - Prior to the procedure, a History and Physical                            was performed, and patient medications and                            allergies were reviewed. The patient's tolerance of                            previous anesthesia was also reviewed. The risks                            and benefits of the procedure and the sedation                            options and risks were discussed with the patient.                            All questions were answered, and informed consent                            was obtained. Prior Anticoagulants: The patient has                            taken aspirin, last dose was 4 days prior to  procedure. ASA Grade Assessment: II - A patient                            with mild systemic disease. After reviewing the                            risks and benefits, the patient was deemed in                            satisfactory condition to undergo the procedure.                            After obtaining informed consent, the colonoscope    was passed under direct vision. Throughout the                            procedure, the patient's blood pressure, pulse, and                            oxygen saturations were monitored continuously. The                            EC-[id number] ([id number]) scope was introduced through                            the anus and advanced to the the cecum, identified                            by appendiceal orifice and ileocecal valve. The                            colonoscopy was technically difficult and complex                            due to significant looping. Successful completion                            of the procedure was aided by changing the patient                            to a supine position, straightening and shortening                            the scope to obtain bowel loop reduction and                            COLOWRAP. The ileocecal valve, appendiceal orifice,                            and rectum were photographed. The patient tolerated                            the procedure well. The quality of the bowel  preparation was excellent. Scope In: 9:48:42 AM Scope Out: 10:13:14 AM Scope Withdrawal Time: 0 hours 14 minutes 29 seconds  Total Procedure Duration: 0 hours 24 minutes 32 seconds  Findings:      A 4 mm polyp was found in the hepatic flexure. The polyp was sessile.       The polyp was removed with a cold snare. Resection and retrieval were       complete.      A few small and large-mouthed diverticula were found in the mid       ascending colon.      The recto-sigmoid colon, sigmoid colon and descending colon were       significantly redundant.      External and internal hemorrhoids were found during retroflexion. The       hemorrhoids were moderate. Impression:               - One 4 mm polyp at the hepatic flexure, removed                            with a cold snare.                           - MILD Diverticulosis in the mid  ascending colon.                           - EXTREMEMLY Redundant LEFT colon.                           - External and internal hemorrhoids.                           - NO SOURCE FOR ANEMIA IDENTIFIED. Moderate Sedation:      Per Anesthesia Care Recommendation:           - Repeat colonoscopy in 5-10 years for surveillance.                           - High fiber diet.                           - Continue present medications.                           - Await pathology results.                           - Return to my office in 4 months.                           - Patient has a contact number available for                            emergencies. The signs and symptoms of potential                            delayed complications were discussed with the  patient. Return to normal activities tomorrow.                            Written discharge instructions were provided to the                            patient. Procedure Code(s):        --- Professional ---                           [phone], Colonoscopy, flexible; with removal of                            tumor(s), polyp(s), or other lesion(s) by snare                            technique Diagnosis Code(s):        --- Professional ---                           D12.3, Benign neoplasm of transverse colon (hepatic                            flexure or splenic flexure)                           K64.8, Other hemorrhoids                           D64.9, Anemia, unspecified                           K57.30, Diverticulosis of large intestine without                            perforation or abscess without bleeding                           Q43.8, Other specified congenital malformations of                            intestine CPT copyright 2016 American Medical Association. All rights reserved. The codes documented in this report are preliminary and upon coder review may  be revised to meet current compliance  requirements. Barney Drain, MD Barney Drain, MD 08/13/2016 10:35:13 AM This report has been signed electronically. Number of Addenda: 0

## 2016-08-13 NOTE — H&P (Signed)
Primary Care Physician:  Keane Police, MD Primary Gastroenterologist:  Dr. Oneida Alar  Pre-Procedure History & Physical: HPI:  Christine Bolton is a 71 y.o. female here for PMHx: AUTOIMMUNE HEMOLYTIC ANEMIA. CONCERN FOR OBSCURE GIB DUE TO HB < 10.  Past Medical History:  Diagnosis Date  . Arthritis   . Asthma   . Constipation   . Diabetes ([place])   . Dyspnea   . GERD (gastroesophageal reflux disease)   . 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 HTN (hypertension)   . Hypercholesterolemia   . IDA (iron deficiency anemia)   . Neuromuscular disorder ([place])   . Sleep apnea     Past Surgical History:  Procedure Laterality Date  . BACK SURGERY    . CAROTID ENDARTERECTOMY    . COLONOSCOPY  2014   normal (?) she is not sure  . ECTOPIC PREGNANCY SURGERY    . fibroid tumor    . lower extremity bypass    . PARTIAL HYSTERECTOMY      Prior to Admission medications   Medication Sig Start Date End Date Taking? Authorizing Provider  Albuterol Sulfate 108 (90 Base) MCG/ACT AEPB Inhale 1 puff into the lungs daily as needed (wheezing).   Yes [provider]  alendronate (FOSAMAX) 70 MG tablet  05/11/16  Yes [provider]  amLODipine (NORVASC) 10 MG tablet Take 1 tablet by mouth daily. 04/16/11  Yes [provider]  aspirin 81 MG chewable tablet Take 81 mg by mouth daily.     Yes [provider]  atorvastatin (LIPITOR) 40 MG tablet Take 1 tablet by mouth daily. 04/17/11  Yes [provider]  Calcium Carbonate-Vitamin D (CALTRATE 600+D) 600-400 MG-UNIT tablet Take 1 tablet by mouth daily.   Yes [provider]  carvedilol (COREG) 6.25 MG tablet Take 6.25 mg by mouth 2 (two) times daily with a meal.   Yes [provider]  ferrous sulfate 325 (65 FE) MG EC tablet Take 325 mg by mouth 2 (two) times daily.   Yes [provider]  folic acid (FOLVITE) 1 MG tablet Take 1 tablet by mouth daily. 04/16/11  Yes [provider]  hydrOXYzine  (VISTARIL) 25 MG capsule Take 25 mg by mouth at bedtime.   Yes [provider]  INCRUSE ELLIPTA 62.5 MCG/INH AEPB INHALE 1 PUFF PO QD 05/01/16  Yes [provider]  ipratropium (ATROVENT) 0.06 % nasal spray SPRAY 1 SPRAY IN BOTH NOSTRILS Q 4 HOURS PRN 06/14/16  Yes [provider]  losartan (COZAAR) 100 MG tablet Take 100 mg by mouth daily.   Yes [provider]  MAGNESIUM-OXIDE 400 (241.3 Mg) MG tablet TK 1 T PO BID 06/27/16  Yes [provider]  metFORMIN (GLUCOPHAGE) 500 MG tablet Take 500 mg by mouth daily (with breakfast). Take with meal    Yes [provider]  montelukast (SINGULAIR) 10 MG tablet Take 1 tablet in the evening 06/24/16  Yes [provider]  naproxen (NAPROSYN) 500 MG tablet TK 1 T PO Q 12 H X 10 DAYS 04/22/16  Yes [provider]  olopatadine (PATANOL) 0.1 % ophthalmic solution Place 1 drop into both eyes at bedtime.   Yes [provider]  omeprazole (PRILOSEC) 20 MG capsule Take 1 capsule (20 mg total) by mouth daily. 30 minutes before breakfast 07/17/16  Yes Annitta Needs, NP  PRESCRIPTION MEDICATION Take 1 tablet by mouth daily. Domperidone 10 mg for a.   Yes [provider]  spironolactone (ALDACTONE)  25 MG tablet Take 1 tablet by mouth daily. 04/22/16  Yes [provider]  tiZANidine (ZANAFLEX) 4 MG tablet Take 4 mg by mouth 2 (two) times daily.   Yes [provider]  Topiramate ER (QUDEXY XR) 25 MG CS24 sprinkle cap Take 2 capsules by mouth at bedtime.   Yes [provider]  traMADol (ULTRAM) 50 MG tablet Take 50 mg by mouth every 6 hours as needed.     Yes [provider]  Na Sulfate-K Sulfate-Mg Sulf (SUPREP BOWEL PREP KIT) 17.5-3.13-1.6 GM/180ML SOLN Take 1 kit by mouth as directed. Patient not taking: Reported on 08/08/2016 07/17/16   Danie Binder, MD    Allergies as of 07/17/2016 - Review Complete 07/17/2016  Allergen Reaction Noted  . Levofloxacin  Other (See Comments) 04/12/2011    Family History  Problem Relation Age of Onset  . Colon cancer Neg Hx        no first degree relatives. States cousin had colon cancer  . Colon polyps Neg Hx     Social History   Social History  . Marital status: Divorced    Spouse name: N/A  . Number of children: N/A  . Years of education: N/A   Occupational History  . Not on file.   Social History Main Topics  . Smoking status: Former Smoker    Types: Cigarettes    Quit date: 2002  . Smokeless tobacco: Never Used  . Alcohol use No  . Drug use: No  . Sexual activity: Not on file   Other Topics Concern  . Not on file   Social History Narrative  . No narrative on file    Review of Systems: See HPI, otherwise negative ROS   Physical Exam: BP (!) 148/76   Pulse 78   Temp 98.2 F (36.8 C) (Oral)   Resp (!) 37   Ht _0  (1.6 m)   Wt 190 lb (86.2 kg)   SpO2 97%   BMI 33.66 kg/m  General:   Alert,  pleasant and cooperative in NAD Head:  Normocephalic and atraumatic. Neck:  Supple; Lungs:  Clear throughout to auscultation.    Heart:  Regular rate and rhythm. Abdomen:  Soft, nontender and nondistended. Normal bowel sounds, without guarding, and without rebound.   Neurologic:  Alert and  oriented x4;  grossly normal neurologically.  Impression/Plan:    Anemia  PLAN: EGD/TCS TODAY. DISCUSSED PROCEDURE, BENEFITS, & RISKS: < 1% chance of medication reaction, bleeding, perforation, or rupture of spleen/liver.

## 2016-08-13 NOTE — Discharge Instructions (Signed)
You had 1 polyp removed. You have internal and external hemorrhoids and diverticulosis in YOUR RIGHT COLON. 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 I biopsied your stomach, & small bowel.   DRINK WATER TO KEEP YOUR URINE LIGHT YELLOW.  FOLLOW A HIGH FIBER DIET. AVOID ITEMS THAT CAUSE BLOATING. SEE INFO BELOW.  YOUR BIOPSY RESULTS WILL BE AVAILABLE IN MY CHART AFTER  Aug 11 and MY OFFICE WILL CONTACT YOU IN 10-14 DAYS WITH YOUR RESULTS.   If NO SOURCE FOR your low blood count is IDENTIFIED, YOU WILL NEED THE CAMERA PILL.  FOLLOW UP IN 4 MOS.   Next colonoscopy in 5-10 years.   ENDOSCOPY Care After Read the instructions outlined below and refer to this sheet in the next week. These discharge instructions provide you with general information on caring for yourself after you leave the hospital. While your treatment has been planned according to the most current medical practices available, unavoidable complications occasionally occur. If you have any problems or questions after discharge, call DR. Derk Doubek, [phone].  ACTIVITY  You may resume your regular activity, but move at a slower pace for the next 24 hours.   Take frequent rest periods for the next 24 hours.   Walking will help get rid of the air and reduce the bloated feeling in your belly (abdomen).   No driving for 24 hours (because of the medicine (anesthesia) used during the test).   You may shower.   Do not sign any important legal documents or operate any machinery for 24 hours (because of the anesthesia used during the test).    NUTRITION  Drink plenty of fluids.   You may resume your normal diet as instructed by your doctor.   Begin with a light meal and progress to your normal diet. Heavy or fried foods are harder to digest and may make you feel sick to your stomach (nauseated).   Avoid alcoholic beverages for 24 hours  or as instructed.    MEDICATIONS  You may resume your normal medications.   WHAT YOU CAN EXPECT TODAY  Some feelings of bloating in the abdomen.   Passage of more gas than usual.   Spotting of blood in your stool or on the toilet paper  .  IF YOU HAD POLYPS REMOVED DURING THE ENDOSCOPY:  Eat a soft diet IF YOU HAVE NAUSEA, BLOATING, ABDOMINAL PAIN, OR VOMITING.    FINDING OUT THE RESULTS OF YOUR TEST Not all test results are available during your visit. DR. Oneida Alar WILL CALL YOU WITHIN 14 DAYS OF YOUR PROCEDUE WITH YOUR RESULTS. Do not assume everything is normal if you have not heard from DR. Chisom Muntean, CALL HER OFFICE AT [phone].  SEEK IMMEDIATE MEDICAL ATTENTION AND CALL THE OFFICE: [phone] IF:  You have more than a spotting of blood in your stool.   Your belly is swollen (abdominal distention).   You are nauseated or vomiting.   You have a temperature over 101F.   You have abdominal pain or discomfort that is severe or gets worse throughout the day.   Gastritis  Gastritis is an inflammation (the body's way of reacting to injury and/or infection) of the stomach. It is often caused by viral or bacterial (germ) infections. It can also be caused BY ASPIRIN, BC/GOODY POWDER'S, (IBUPROFEN) MOTRIN, OR ALEVE (NAPROXEN), chemicals (including alcohol), SPICY FOODS, and medications. This illness may be associated with generalized  malaise (feeling tired, not well), UPPER ABDOMINAL STOMACH cramps, and fever. One common bacterial cause of gastritis is an organism known as H. Pylori. This can be treated with antibiotics.    Hiatal Hernia A hiatal hernia occurs when a part of the stomach slides above the diaphragm. The diaphragm is the thin muscle separating the belly (abdomen) from the chest. A hiatal hernia can be something you are born with or develop over time. Hiatal hernias may allow stomach acid to flow back into your esophagus, the tube which carries food from your mouth  to your stomach. If this acid causes problems it is called GERD (gastro-esophageal reflux disease).   SYMPTOMS Common symptoms of GERD are heartburn (burning in your chest). This is worse when lying down or bending over. It may also cause belching and indigestion. Some of the things which make GERD worse are:  Increased weight pushes on stomach making acid rise more easily.   Smoking markedly increases acid production.   Alcohol decreases lower esophageal sphincter pressure (valve between stomach and esophagus), allowing acid from stomach into esophagus.   Late evening meals and going to bed with a full stomach increases pressure.   Anything that causes an increase in acid production.    HOME CARE INSTRUCTIONS  Try to achieve and maintain an ideal body weight.   Avoid drinking alcoholic beverages.   DO NOT smokE.   Do not wear tight clothing around your chest or stomach.   Eat smaller meals and eat more frequently. This keeps your stomach from getting too full. Eat slowly.   Do not lie down for 2 or 3 hours after eating. Do not eat or drink anything 1 to 2 hours before going to bed.   Avoid caffeine beverages (colas, coffee, cocoa, tea), fatty foods, citrus fruits and all other foods and drinks that contain acid and that seem to increase the problems.   Avoid bending over, especially after eating OR STRAINING. Anything that increases the pressure in your belly increases the amount of acid that may be pushed up into your esophagus.    High-Fiber Diet A high-fiber diet changes your normal diet to include more whole grains, legumes, fruits, and vegetables. Changes in the diet involve replacing refined carbohydrates with unrefined foods. The calorie level of the diet is essentially unchanged. The Dietary Reference Intake (recommended amount) for adult males is 38 grams per day. For adult females, it is 25 grams per day. Pregnant and lactating women should consume 28 grams of fiber per  day. Fiber is the intact part of a plant that is not broken down during digestion. Functional fiber is fiber that has been isolated from the plant to provide a beneficial effect in the body. PURPOSE  Increase stool bulk.   Ease and regulate bowel movements.   Lower cholesterol.  INDICATIONS THAT YOU NEED MORE FIBER  Constipation and hemorrhoids.   Uncomplicated diverticulosis (intestine condition) and irritable bowel syndrome.   Weight management.   As a protective measure against hardening of the arteries (atherosclerosis), diabetes, and cancer.   GUIDELINES FOR INCREASING FIBER IN THE DIET  Start adding fiber to the diet slowly. A gradual increase of about 5 more grams (2 slices of whole-wheat bread, 2 servings of most fruits or vegetables, or 1 bowl of high-fiber cereal) per day is best. Too rapid an increase in fiber may result in constipation, flatulence, and bloating.   Drink enough water and fluids to keep your urine clear or pale yellow. Water,  juice, or caffeine-free drinks are recommended. Not drinking enough fluid may cause constipation.   Eat a variety of high-fiber foods rather than one type of fiber.   Try to increase your intake of fiber through using high-fiber foods rather than fiber pills or supplements that contain small amounts of fiber.   The goal is to change the types of food eaten. Do not supplement your present diet with high-fiber foods, but replace foods in your present diet.  INCLUDE A VARIETY OF FIBER SOURCES  Replace refined and processed grains with whole grains, canned fruits with fresh fruits, and incorporate other fiber sources. White rice, white breads, and most bakery goods contain little or no fiber.   Brown whole-grain rice, buckwheat oats, and many fruits and vegetables are all good sources of fiber. These include: broccoli, Brussels sprouts, cabbage, cauliflower, beets, sweet potatoes, white potatoes (skin on), carrots, tomatoes, eggplant,  squash, berries, fresh fruits, and dried fruits.   Cereals appear to be the richest source of fiber. Cereal fiber is found in whole grains and bran. Bran is the fiber-rich outer coat of cereal grain, which is largely removed in refining. In whole-grain cereals, the bran remains. In breakfast cereals, the largest amount of fiber is found in those with "bran" in their names. The fiber content is sometimes indicated on the label.   You may need to include additional fruits and vegetables each day.   In baking, for 1 cup white flour, you may use the following substitutions:   1 cup whole-wheat flour minus 2 tablespoons.   1/2 cup white flour plus 1/2 cup whole-wheat flour.   Diverticulosis Diverticulosis is a common condition that develops when small pouches (diverticula) form in the wall of the colon. The risk of diverticulosis increases with age. It happens more often in people who eat a low-fiber diet. Most individuals with diverticulosis have no symptoms. Those individuals with symptoms usually experience belly (abdominal) pain, constipation, or loose stools (diarrhea).  HOME CARE INSTRUCTIONS  Increase the amount of fiber in your diet as directed by your caregiver or dietician. This may reduce symptoms of diverticulosis.   Drink at least 6 to 8 glasses of water each day to prevent constipation.   Try not to strain when you have a bowel movement.   Avoiding nuts and seeds to prevent complications is NOT NECESSARY.    FOODS HAVING HIGH FIBER CONTENT INCLUDE:  Fruits. Apple, peach, pear, tangerine, raisins, prunes.   Vegetables. Brussels sprouts, asparagus, broccoli, cabbage, carrot, cauliflower, romaine lettuce, spinach, summer squash, tomato, winter squash, zucchini.   Starchy Vegetables. Baked beans, kidney beans, lima beans, split peas, lentils, potatoes (with skin).   Grains. Whole wheat bread, brown rice, bran flake cereal, plain oatmeal, white rice, shredded wheat, bran muffins.     SEEK IMMEDIATE MEDICAL CARE IF:  You develop increasing pain or severe bloating.   You have an oral temperature above 101F.   You develop vomiting or bowel movements that are bloody or black.   Hemorrhoids Hemorrhoids are dilated (enlarged) veins around the rectum. Sometimes clots will form in the veins. This makes them swollen and painful. These are called thrombosed hemorrhoids. Causes of hemorrhoids include:  Constipation.   Straining to have a bowel movement.   HEAVY LIFTING HOME CARE INSTRUCTIONS  Eat a well balanced diet and drink 6 to 8 glasses of water every day to avoid constipation. You may also use a bulk laxative.   Avoid straining to have bowel movements.   Keep  anal area dry and clean.   Do not use a donut shaped pillow or sit on the toilet for long periods. This increases blood pooling and pain.   Move your bowels when your body has the urge; this will require less straining and will decrease pain and pressure.

## 2016-08-13 NOTE — Op Note (Signed)
[facility] Patient Name: Christine Bolton Procedure Date: 08/13/2016 10:16 AM MRN: [id number] Date of Birth: [birth year] Attending MD: Barney Drain , MD CSN: [id number] Age: 71 Admit Type: Outpatient Procedure:                Upper GI endoscopy WITH COLD FORCEP SBIOPSY Indications:              Anemia-USES ASA AND NAPROXEN. PMHx: AIHA. Providers:                Barney Drain, MD, Gwenlyn Fudge RN, RN, Aram Candela Referring MD:             Keane Police, MD Medicines:                Propofol per Anesthesia Complications:            No immediate complications. Estimated Blood Loss:     Estimated blood loss was minimal. Procedure:                Pre-Anesthesia Assessment:                           - Prior to the procedure, a History and Physical                            was performed, and patient medications and                            allergies were reviewed. The patient's tolerance of                            previous anesthesia was also reviewed. The risks                            and benefits of the procedure and the sedation                            options and risks were discussed with the patient.                            All questions were answered, and informed consent                            was obtained. Prior Anticoagulants: The patient has                            taken aspirin, last dose was 4 days prior to                            procedure. ASA Grade Assessment: II - A patient                            with mild systemic disease. After reviewing the                            risks and benefits, the patient was deemed in  satisfactory condition to undergo the procedure.                            After obtaining informed consent, the endoscope was                            passed under direct vision. Throughout the                            procedure, the patient's blood pressure, pulse, and   oxygen saturations were monitored continuously. The                            [id number] ([id number]) scope was introduced through the                            mouth, and advanced to the second part of duodenum.                            The upper GI endoscopy was accomplished without                            difficulty. The patient tolerated the procedure                            well. Scope In: 10:19:14 AM Scope Out: 10:28:33 AM Total Procedure Duration: 0 hours 9 minutes 19 seconds  Findings:      A widely patent Schatzki ring (acquired) was found at the       gastroesophageal junction.      Scattered mild inflammation characterized by congestion (edema) and       erythema was found in the entire examined stomach. Biopsies were taken       with a cold forceps for Helicobacter pylori testing OR ATROPHIC       GASTRITIS.      Patchy mild inflammation characterized by congestion (edema) and       erythema was found in the duodenal bulb. This was biopsied(3) with a       cold forceps for evaluation of celiac disease.      The examined duodenum was normal. Biopsies(4) for histology were taken       with a cold forceps for evaluation of celiac disease.      A small hiatal hernia was present. Impression:               - Widely patent Schatzki ring.                           - MID Gastritis/DUODENITIS. Biopsied.                           - SMALL HIATAL HERNIA                           - NO SOURCE FOR ANEMIA IDENTIFIED Moderate Sedation:      Per Anesthesia Care Recommendation:           - Await pathology results. WILL NEED GIVENS  CAPSULE                            IF NO SOURCE FOR ANEMIA IDENTIFIED.                           - High fiber diet.                           - Continue present medications.                           - Return to my office in 4 months.                           - Patient has a contact number available for                            emergencies. The signs and  symptoms of potential                            delayed complications were discussed with the                            patient. Return to normal activities tomorrow.                            Written discharge instructions were provided to the                            patient. Procedure Code(s):        --- Professional ---                           [phone], Esophagogastroduodenoscopy, flexible,                            transoral; with biopsy, single or multiple Diagnosis Code(s):        --- Professional ---                           K22.2, Esophageal obstruction                           K29.70, Gastritis, unspecified, without bleeding                           K29.80, Duodenitis without bleeding                           D64.9, Anemia, unspecified CPT copyright 2016 [employer]. All rights reserved. The codes documented in this report are preliminary and upon coder review may  be revised to meet current compliance requirements. Barney Drain, MD Barney Drain, MD 08/13/2016 10:41:38 AM This report has been signed electronically. Number of Addenda: 0

## 2016-08-13 NOTE — Transfer of Care (Signed)
Immediate Anesthesia Transfer of Care Note  Patient: Christine Bolton  Procedure(s) Performed: Procedure(s) with comments: COLONOSCOPY WITH PROPOFOL (N/A) - 9:15am ESOPHAGOGASTRODUODENOSCOPY (EGD) WITH PROPOFOL (N/A) POLYPECTOMY - colon  BIOPSY - gastric duodenal  Patient Location: PACU  Anesthesia Type:MAC  Level of Consciousness: awake, oriented and patient cooperative  Airway & Oxygen Therapy: Patient Spontanous Breathing and Patient connected to nasal cannula oxygen  Post-op Assessment: Report given to RN and Post -op Vital signs reviewed and stable  Post vital signs: Reviewed and stable  Last Vitals:  Vitals:   08/13/16 0925 08/13/16 0930  BP: (!) 148/64 137/62  Pulse:    Resp: (!) 21 (!) 25  Temp:      Last Pain:  Vitals:   08/13/16 0732  TempSrc: Oral      Patients Stated Pain Goal: 8 (03/54/65 6812)  Complications: No apparent anesthesia complications

## 2016-08-13 NOTE — Anesthesia Preprocedure Evaluation (Signed)
Anesthesia Evaluation  Patient identified by MRN, date of birth, ID band Patient awake    Reviewed: Allergy & Precautions, NPO status , Patient's Chart, lab work & pertinent test results  Airway Mallampati: II  TM Distance: >3 FB Neck ROM: Full    Dental  (+) Teeth Intact, Partial Upper   Pulmonary shortness of breath and with exertion, asthma , sleep apnea , former smoker,    breath sounds clear to auscultation       Cardiovascular hypertension, Pt. on medications  Rhythm:Regular Rate:Normal     Neuro/Psych    GI/Hepatic GERD  ,  Endo/Other  diabetes, Type 2  Renal/GU      Musculoskeletal  (+) Arthritis ,   Abdominal   Peds  Hematology  (+) Blood dyscrasia, anemia ,   Anesthesia Other Findings   Reproductive/Obstetrics                             Anesthesia Physical Anesthesia Plan  ASA: III  Anesthesia Plan: MAC   Post-op Pain Management:    Induction: Intravenous  PONV Risk Score and Plan:   Airway Management Planned: Simple Face Mask  Additional Equipment:   Intra-op Plan:   Post-operative Plan:   Informed Consent: I have reviewed the patients History and Physical, chart, labs and discussed the procedure including the risks, benefits and alternatives for the proposed anesthesia with the patient or authorized representative who has indicated his/her understanding and acceptance.     Plan Discussed with:   Anesthesia Plan Comments:         Anesthesia Quick Evaluation

## 2016-08-13 NOTE — Anesthesia Postprocedure Evaluation (Signed)
Anesthesia Post Note  Patient: Christine Bolton  Procedure(s) Performed: Procedure(s) (LRB): COLONOSCOPY WITH PROPOFOL (N/A) ESOPHAGOGASTRODUODENOSCOPY (EGD) WITH PROPOFOL (N/A) POLYPECTOMY BIOPSY  Patient location during evaluation: PACU Anesthesia Type: MAC Level of consciousness: awake and alert and oriented Pain management: pain level controlled Vital Signs Assessment: post-procedure vital signs reviewed and stable Respiratory status: spontaneous breathing and patient connected to nasal cannula oxygen Cardiovascular status: stable Postop Assessment: no signs of nausea or vomiting Anesthetic complications: no     Last Vitals:  Vitals:   08/13/16 0925 08/13/16 0930  BP: (!) 148/64 137/62  Pulse:    Resp: (!) 21 (!) 25  Temp:      Last Pain:  Vitals:   08/13/16 0732  TempSrc: Oral                 [name] A

## 2016-08-15 ENCOUNTER — Encounter (HOSPITAL_COMMUNITY): Payer: Self-pay | Admitting: Gastroenterology

## 2016-08-21 ENCOUNTER — Telehealth: Payer: Self-pay | Admitting: Gastroenterology

## 2016-08-21 NOTE — Telephone Encounter (Signed)
Reminder in epic °

## 2016-08-21 NOTE — Telephone Encounter (Signed)
Please call pt. HER stomach Bx shows mild gastritis AND NORMAL SMALL BOWEL BIOPSIES. She had ONE simple adenoma removed. NO SOURCE FOR HER low blood count WAS IDENTIFIED,  SHE NEEDS THE CAMERA PILL(GIVENS, Dx: OBSCURE GI BLEED/ANEMIA).   DRINK WATER TO KEEP YOUR URINE LIGHT YELLOW. FOLLOW A HIGH FIBER DIET. AVOID ITEMS THAT CAUSE BLOATING.  FOLLOW UP IN 4 MOS E30 DYSPHAGIA/ANEMIA.   Next colonoscopy in 5-10 years.

## 2016-08-21 NOTE — Telephone Encounter (Signed)
Tried to call with no answer  

## 2016-08-22 NOTE — Telephone Encounter (Signed)
LMOM to call.

## 2016-08-22 NOTE — Telephone Encounter (Signed)
Pt is out of town for a family emergency. She said that she would all back when she gets back. She is also asking about her mouth and teeth have been hurting since her EGD.

## 2016-08-23 NOTE — Telephone Encounter (Signed)
PLEASE CALL PT. IF HER MOUTH AND TEETH ARE HURTING SHE SHOULD SEE HER DENTIST. THE EGD SHOULD NOT MAKE HER MOUTH HURT 10 DAYS AFTER HER EGD.

## 2016-08-26 NOTE — Telephone Encounter (Signed)
LMOM to call.

## 2016-08-27 NOTE — Telephone Encounter (Signed)
Letter mailed to pt to call.  

## 2016-08-30 NOTE — Telephone Encounter (Signed)
PLEASE CALL PT. She should see her dentist for mouth/teeth. SHE OR HE CAN DO XRAYS IF NEEDED.

## 2016-08-30 NOTE — Telephone Encounter (Signed)
Pt called for results and was given that and she said OK to schedule the camera pill test.  She is aware that Dr. Oneida Alar said she should see her dentist because of her mouth, teeth pain, but is very concerned that she had never been bothered with that until after she had the upper endoscopy.  She was hoping Dr. Oneida Alar could order an Xray to just check on it since she had never been bothered before.   Forwarding to Dr. Oneida Alar and [facility] Clinical Staff to schedule the test.  Pt would like it to be scheduled after lunch time if possible.

## 2016-08-30 NOTE — Telephone Encounter (Signed)
Tried to call patient with no answer  

## 2016-08-30 NOTE — Telephone Encounter (Signed)
LMOM to call.

## 2016-09-02 NOTE — Telephone Encounter (Signed)
Pt is set up for GIVENS on 09/10/16 @ 7:00 am. NO PA is needed per the website

## 2016-09-03 NOTE — Telephone Encounter (Signed)
LMOM that she needs to see the dentist to see if she needs x-rays.

## 2016-09-10 ENCOUNTER — Encounter (HOSPITAL_COMMUNITY)
Admission: AD | Disposition: A | Discharge status: Home / Self Care | Payer: Self-pay | Source: Ambulatory Visit | Attending: Gastroenterology

## 2016-09-10 ENCOUNTER — Ambulatory Visit (HOSPITAL_COMMUNITY)
Admission: AD | Admit: 2016-09-10 | Discharge: 2016-09-10 | Disposition: A | Discharge status: Home / Self Care | Payer: Medicare HMO | Source: Ambulatory Visit | Attending: Gastroenterology | Admitting: Gastroenterology

## 2016-09-10 DIAGNOSIS — K5520 Angiodysplasia of colon without hemorrhage: Secondary | ICD-10-CM | POA: Diagnosis not present

## 2016-09-10 DIAGNOSIS — D500 Iron deficiency anemia secondary to blood loss (chronic): Secondary | ICD-10-CM | POA: Diagnosis present

## 2016-09-10 HISTORY — PX: GIVENS CAPSULE STUDY: SHX5432

## 2016-09-10 SURGERY — IMAGING PROCEDURE, GI TRACT, INTRALUMINAL, VIA CAPSULE

## 2016-09-10 MED ORDER — SIMETHICONE 40 MG/0.6ML PO SUSP
ORAL | Status: AC
  Filled 2016-09-10: qty 0.6

## 2016-09-10 NOTE — OR Nursing (Signed)
Patient showed up for a Givens capsule study and not scheduled in [facility]. Contacted Dr. Oneida Alar who said it is ok to proceed with procedure. Patient questioned if she had followed up with her dentist regarding her tooth pain and she said that she has not.

## 2016-09-12 ENCOUNTER — Encounter (HOSPITAL_COMMUNITY): Payer: Self-pay | Admitting: Gastroenterology

## 2016-09-20 ENCOUNTER — Telehealth: Payer: Self-pay | Admitting: Gastroenterology

## 2016-09-20 NOTE — Telephone Encounter (Signed)
LAST BLOOD TRANSFUSION WAS 5 YRS AGO. BEEN TAKING ORAL FOR PAST 2-3 YRS. SEES BLACK FORMED BUT STICKS TO TOILET(BLACK TARRY) NOT EVERY DAY. MAY NEED LINZESS  TO HAVE LINZESS. PT NEEDS ENTEROSCOPY WITH MAC. EXPLAINED PROCEDURE AND ANSWERED.    SEND A COPY OF THIS TC AND GIVENS REPORT TO DR. MADAN. NEXT OPV IN DEC 2018.

## 2016-09-20 NOTE — Procedures (Signed)
PATIENT DATA: WEIGHT: 177 LBS     WAIST:  43 IN    HEIGHT:  62 IN   GASTRIC PASSAGE TIME: 61 m, SB PASSAGE TIME: 4H 31m  RESULTS: LIMITED views of gastric mucosa due to retained contents.  SML AMOUNT OF BRIGHT RED BLOOD/PROBABLE AVM SEEN AT 1:27:13(DUODENUM-3% SB TIME). ADDITIONAL DISTAL SMALL BOWEL OCCASIONALLY OBSCURED BY RETAINED FOOD IN LUMEN. FREQUENT AVMs FROM 2:19:41(16% SB TIME) TO 7:53:12(98% SB TIME). LIMITED VIEWS OF THE COLON DUE TO RETAINED CONTENTS. No old blood or fresh blood in the stomach AND colon.  DIAGNOSIS: TRANSFUSION DEPENDENT ANEMIA DUE TO GI BLEED LOSS DUE TO SMALL BOWEL AVMs  Plan: 1. CONSIDER ENTEROSCOPY TO ABLATE AVMs IN PROXIMAL SMALL BOWEL.

## 2016-09-23 ENCOUNTER — Other Ambulatory Visit: Payer: Self-pay

## 2016-09-23 DIAGNOSIS — D649 Anemia, unspecified: Secondary | ICD-10-CM

## 2016-09-23 NOTE — Telephone Encounter (Signed)
Called pt. Enteroscopy w/Propofol with SLF scheduled for 10/15/16 at 9:15am. Will mail instructions after pre-op is scheduled.

## 2016-09-23 NOTE — Telephone Encounter (Signed)
Called and informed pt of pre-op appt 10/11/16 at 11:00am. Letter mailed with procedure instructions.

## 2016-09-23 NOTE — Telephone Encounter (Signed)
Noted  

## 2016-09-23 NOTE — Telephone Encounter (Signed)
Patient scheduled.

## 2016-10-08 NOTE — Patient Instructions (Signed)
Christine Bolton  10/08/2016     @PREFPERIOPPHARMACY @   Your procedure is scheduled on  10/15/2016   Report to [facility] at  740  A.M.  Call this number if you have problems the morning of surgery:  [phone]   Remember:  Do not eat food or drink liquids after midnight.  Take these medicines the morning of surgery with A SIP OF WATER  Amlodipine, coreg, cozaar, singulair, prilosec, zanaflex, ultram. Use your inhalers before you come. DO NOT take anything for your diabetes the morning of your procedure.   Do not wear jewelry, make-up or nail polish.  Do not wear lotions, powders, or perfumes, or deoderant.  Do not shave 48 hours prior to surgery.  Men may shave face and neck.  Do not bring valuables to the hospital.  [facility] is not responsible for any belongings or valuables.  Contacts, dentures or bridgework may not be worn into surgery.  Leave your suitcase in the car.  After surgery it may be brought to your room.  For patients admitted to the hospital, discharge time will be determined by your treatment team.  Patients discharged the day of surgery will not be allowed to drive home.   Name and phone number of your driver:   family Special instructions:  Follow the diet instructions given to you by Dr Nona Dell office.  Please read over the following fact sheets that you were given. Anesthesia Post-op Instructions and Care and Recovery After Surgery       Esophagogastroduodenoscopy Esophagogastroduodenoscopy (EGD) is a procedure to examine the lining of the esophagus, stomach, and first part of the small intestine (duodenum). This procedure is done to check for problems such as inflammation, bleeding, ulcers, or growths. During this procedure, a long, flexible, lighted tube with a camera attached (endoscope) is inserted down the throat. Tell a health care provider about:  Any allergies you have.  All medicines you are taking, including vitamins,  herbs, eye drops, creams, and over-the-counter medicines.  Any problems you or family members have had with anesthetic medicines.  Any blood disorders you have.  Any surgeries you have had.  Any medical conditions you have.  Whether you are pregnant or may be pregnant. What are the risks? Generally, this is a safe procedure. However, problems may occur, including:  Infection.  Bleeding.  A tear (perforation) in the esophagus, stomach, or duodenum.  Trouble breathing.  Excessive sweating.  Spasms of the larynx.  A slowed heartbeat.  Low blood pressure.  What happens before the procedure?  Follow instructions from your health care provider about eating or drinking restrictions.  Ask your health care provider about: ? Changing or stopping your regular medicines. This is especially important if you are taking diabetes medicines or blood thinners. ? Taking medicines such as aspirin and ibuprofen. These medicines can thin your blood. Do not take these medicines before your procedure if your health care provider instructs you not to.  Plan to have someone take you home after the procedure.  If you wear dentures, be ready to remove them before the procedure. What happens during the procedure?  To reduce your risk of infection, your health care team will wash or sanitize their hands.  An IV tube will be put in a vein in your hand or arm. You will get medicines and fluids through this tube.  You will be given one or more of the following: ? A  medicine to help you relax (sedative). ? A medicine to numb the area (local anesthetic). This medicine may be sprayed into your throat. It will make you feel more comfortable and keep you from gagging or coughing during the procedure. ? A medicine for pain.  A mouth guard may be placed in your mouth to protect your teeth and to keep you from biting on the endoscope.  You will be asked to lie on your left side.  The endoscope will be  lowered down your throat into your esophagus, stomach, and duodenum.  Air will be put into the endoscope. This will help your health care provider see better.  The lining of your esophagus, stomach, and duodenum will be examined.  Your health care provider may: ? Take a tissue sample so it can be looked at in a lab (biopsy). ? Remove growths. ? Remove objects (foreign bodies) that are stuck. ? Treat any bleeding with medicines or other devices that stop tissue from bleeding. ? Widen (dilate) or stretch narrowed areas of your esophagus and stomach.  The endoscope will be taken out. The procedure may vary among health care providers and hospitals. What happens after the procedure?  Your blood pressure, heart rate, breathing rate, and blood oxygen level will be monitored often until the medicines you were given have worn off.  Do not eat or drink anything until the numbing medicine has worn off and your gag reflex has returned. This information is not intended to replace advice given to you by your health care provider. Make sure you discuss any questions you have with your health care provider. Document Released: 04/26/2004 Document Revised: 06/01/2015 Document Reviewed: 11/17/2014 Elsevier Interactive Patient Education  2018 [employer]. Esophagogastroduodenoscopy, Care After Refer to this sheet in the next few weeks. These instructions provide you with information about caring for yourself after your procedure. Your health care provider may also give you more specific instructions. Your treatment has been planned according to current medical practices, but problems sometimes occur. Call your health care provider if you have any problems or questions after your procedure. What can I expect after the procedure? After the procedure, it is common to have:  A sore throat.  Nausea.  Bloating.  Dizziness.  Fatigue.  Follow these instructions at home:  Do not eat or drink anything  until the numbing medicine (local anesthetic) has worn off and your gag reflex has returned. You will know that the local anesthetic has worn off when you can swallow comfortably.  Do not drive for 24 hours if you received a medicine to help you relax (sedative).  If your health care provider took a tissue sample for testing during the procedure, make sure to get your test results. This is your responsibility. Ask your health care provider or the department performing the test when your results will be ready.  Keep all follow-up visits as told by your health care provider. This is important. Contact a health care provider if:  You cannot stop coughing.  You are not urinating.  You are urinating less than usual. Get help right away if:  You have trouble swallowing.  You cannot eat or drink.  You have throat or chest pain that gets worse.  You are dizzy or light-headed.  You faint.  You have nausea or vomiting.  You have chills.  You have a fever.  You have severe abdominal pain.  You have black, tarry, or bloody stools. This information is not intended to replace  advice given to you by your health care provider. Make sure you discuss any questions you have with your health care provider. Document Released: 12/11/2011 Document Revised: 06/01/2015 Document Reviewed: 11/17/2014 Elsevier Interactive Patient Education  2018 [employer] Anesthesia is a term that refers to techniques, procedures, and medicines that help a person stay safe and comfortable during a medical procedure. Monitored anesthesia care, or sedation, is one type of anesthesia. Your anesthesia specialist may recommend sedation if you will be having a procedure that does not require you to be unconscious, such as:  Cataract surgery.  A dental procedure.  A biopsy.  A colonoscopy.  During the procedure, you may receive a medicine to help you relax (sedative). There are three  levels of sedation:  Mild sedation. At this level, you may feel awake and relaxed. You will be able to follow directions.  Moderate sedation. At this level, you will be sleepy. You may not remember the procedure.  Deep sedation. At this level, you will be asleep. You will not remember the procedure.  The more medicine you are given, the deeper your level of sedation will be. Depending on how you respond to the procedure, the anesthesia specialist may change your level of sedation or the type of anesthesia to fit your needs. An anesthesia specialist will monitor you closely during the procedure. Let your health care provider know about:  Any allergies you have.  All medicines you are taking, including vitamins, herbs, eye drops, creams, and over-the-counter medicines.  Any use of steroids (by mouth or as a cream).  Any problems you or family members have had with sedatives and anesthetic medicines.  Any blood disorders you have.  Any surgeries you have had.  Any medical conditions you have, such as sleep apnea.  Whether you are pregnant or may be pregnant.  Any use of cigarettes, alcohol, or street drugs. What are the risks? Generally, this is a safe procedure. However, problems may occur, including:  Getting too much medicine (oversedation).  Nausea.  Allergic reaction to medicines.  Trouble breathing. If this happens, a breathing tube may be used to help with breathing. It will be removed when you are awake and breathing on your own.  Heart trouble.  Lung trouble.  Before the procedure Staying hydrated Follow instructions from your health care provider about hydration, which may include:  Up to 2 hours before the procedure - you may continue to drink clear liquids, such as water, clear fruit juice, black coffee, and plain tea.  Eating and drinking restrictions Follow instructions from your health care provider about eating and drinking, which may include:  8 hours  before the procedure - stop eating heavy meals or foods such as meat, fried foods, or fatty foods.  6 hours before the procedure - stop eating light meals or foods, such as toast or cereal.  6 hours before the procedure - stop drinking milk or drinks that contain milk.  2 hours before the procedure - stop drinking clear liquids.  Medicines Ask your health care provider about:  Changing or stopping your regular medicines. This is especially important if you are taking diabetes medicines or blood thinners.  Taking medicines such as aspirin and ibuprofen. These medicines can thin your blood. Do not take these medicines before your procedure if your health care provider instructs you not to.  Tests and exams  You will have a physical exam.  You may have blood tests done to show: ?  How well your kidneys and liver are working. ? How well your blood can clot.  General instructions  Plan to have someone take you home from the hospital or clinic.  If you will be going home right after the procedure, plan to have someone with you for 24 hours.  What happens during the procedure?  Your blood pressure, heart rate, breathing, level of pain and overall condition will be monitored.  An IV tube will be inserted into one of your veins.  Your anesthesia specialist will give you medicines as needed to keep you comfortable during the procedure. This may mean changing the level of sedation.  The procedure will be performed. After the procedure  Your blood pressure, heart rate, breathing rate, and blood oxygen level will be monitored until the medicines you were given have worn off.  Do not drive for 24 hours if you received a sedative.  You may: ? Feel sleepy, clumsy, or nauseous. ? Feel forgetful about what happened after the procedure. ? Have a sore throat if you had a breathing tube during the procedure. ? Vomit. This information is not intended to replace advice given to you by your  health care provider. Make sure you discuss any questions you have with your health care provider. Document Released: 09/19/2004 Document Revised: 06/02/2015 Document Reviewed: 04/16/2015 Elsevier Interactive Patient Education  2018 [place], Care After These instructions provide you with information about caring for yourself after your procedure. Your health care provider may also give you more specific instructions. Your treatment has been planned according to current medical practices, but problems sometimes occur. Call your health care provider if you have any problems or questions after your procedure. What can I expect after the procedure? After your procedure, it is common to:  Feel sleepy for several hours.  Feel clumsy and have poor balance for several hours.  Feel forgetful about what happened after the procedure.  Have poor judgment for several hours.  Feel nauseous or vomit.  Have a sore throat if you had a breathing tube during the procedure.  Follow these instructions at home: For at least 24 hours after the procedure:   Do not: ? Participate in activities in which you could fall or become injured. ? Drive. ? Use heavy machinery. ? Drink alcohol. ? Take sleeping pills or medicines that cause drowsiness. ? Make important decisions or sign legal documents. ? Take care of children on your own.  Rest. Eating and drinking  Follow the diet that is recommended by your health care provider.  If you vomit, drink water, juice, or soup when you can drink without vomiting.  Make sure you have little or no nausea before eating solid foods. General instructions  Have a responsible adult stay with you until you are awake and alert.  Take over-the-counter and prescription medicines only as told by your health care provider.  If you smoke, do not smoke without supervision.  Keep all follow-up visits as told by your health care provider.  This is important. Contact a health care provider if:  You keep feeling nauseous or you keep vomiting.  You feel light-headed.  You develop a rash.  You have a fever. Get help right away if:  You have trouble breathing. This information is not intended to replace advice given to you by your health care provider. Make sure you discuss any questions you have with your health care provider. Document Released: 04/16/2015 Document Revised: 08/16/2015 Document Reviewed: 04/16/2015  [employer] Patient Education  [employer].

## 2016-10-11 ENCOUNTER — Encounter (HOSPITAL_COMMUNITY): Payer: Self-pay

## 2016-10-11 ENCOUNTER — Encounter (HOSPITAL_COMMUNITY)
Admission: RE | Admit: 2016-10-11 | Discharge: 2016-10-11 | Disposition: A | Discharge status: Home / Self Care | Payer: Medicare HMO | Source: Ambulatory Visit | Attending: Gastroenterology | Admitting: Gastroenterology

## 2016-10-11 DIAGNOSIS — E7800 Pure hypercholesterolemia, unspecified: Secondary | ICD-10-CM | POA: Diagnosis not present

## 2016-10-11 DIAGNOSIS — E119 Type 2 diabetes mellitus without complications: Secondary | ICD-10-CM | POA: Diagnosis not present

## 2016-10-11 DIAGNOSIS — G4730 Sleep apnea, unspecified: Secondary | ICD-10-CM | POA: Diagnosis not present

## 2016-10-11 DIAGNOSIS — I509 Heart failure, unspecified: Secondary | ICD-10-CM | POA: Diagnosis not present

## 2016-10-11 DIAGNOSIS — J449 Chronic obstructive pulmonary disease, unspecified: Secondary | ICD-10-CM | POA: Insufficient documentation

## 2016-10-11 DIAGNOSIS — I110 Hypertensive heart disease with heart failure: Secondary | ICD-10-CM | POA: Insufficient documentation

## 2016-10-11 DIAGNOSIS — D509 Iron deficiency anemia, unspecified: Secondary | ICD-10-CM | POA: Diagnosis not present

## 2016-10-11 DIAGNOSIS — Z01812 Encounter for preprocedural laboratory examination: Secondary | ICD-10-CM | POA: Insufficient documentation

## 2016-10-11 DIAGNOSIS — R0609 Other forms of dyspnea: Secondary | ICD-10-CM | POA: Insufficient documentation

## 2016-10-11 DIAGNOSIS — G709 Myoneural disorder, unspecified: Secondary | ICD-10-CM | POA: Diagnosis not present

## 2016-10-11 DIAGNOSIS — K219 Gastro-esophageal reflux disease without esophagitis: Secondary | ICD-10-CM | POA: Diagnosis not present

## 2016-10-11 HISTORY — DX: Chronic obstructive pulmonary disease, unspecified: J44.9

## 2016-10-11 HISTORY — DX: Atherosclerotic heart disease of native coronary artery without angina pectoris: I25.10

## 2016-10-11 HISTORY — DX: Heart failure, unspecified: I50.9

## 2016-10-11 LAB — CBC WITH DIFFERENTIAL/PLATELET
Basophils Absolute: 0 10*3/uL (ref 0.0–0.1)
Basophils Relative: 0 %
EOS PCT: 0 %
Eosinophils Absolute: 0 10*3/uL (ref 0.0–0.7)
HEMATOCRIT: 33.7 % — AB (ref 36.0–46.0)
Hemoglobin: 10.1 g/dL — ABNORMAL LOW (ref 12.0–15.0)
LYMPHS ABS: 1.9 10*3/uL (ref 0.7–4.0)
LYMPHS PCT: 11 %
MCH: 24.9 pg — AB (ref 26.0–34.0)
MCHC: 30 g/dL (ref 30.0–36.0)
MCV: 83 fL (ref 78.0–100.0)
Monocytes Absolute: 0.7 10*3/uL (ref 0.1–1.0)
Monocytes Relative: 4 %
NEUTROS ABS: 14.8 10*3/uL — AB (ref 1.7–7.7)
Neutrophils Relative %: 85 %
PLATELETS: 195 10*3/uL (ref 150–400)
RBC: 4.06 MIL/uL (ref 3.87–5.11)
RDW: 17 % — ABNORMAL HIGH (ref 11.5–15.5)
WBC: 17.5 10*3/uL — AB (ref 4.0–10.5)

## 2016-10-11 LAB — BASIC METABOLIC PANEL
Anion gap: 9 (ref 5–15)
BUN: 28 mg/dL — AB (ref 6–20)
CHLORIDE: 105 mmol/L (ref 101–111)
CO2: 25 mmol/L (ref 22–32)
Calcium: 9.7 mg/dL (ref 8.9–10.3)
Creatinine, Ser: 0.96 mg/dL (ref 0.44–1.00)
GFR calc Af Amer: >60 mL/min (ref >60)
GFR, EST NON AFRICAN AMERICAN: 59 mL/min — AB (ref >60)
GLUCOSE: 158 mg/dL — AB (ref 65–99)
POTASSIUM: 4.2 mmol/L (ref 3.5–5.1)
Sodium: 139 mmol/L (ref 135–145)

## 2016-10-14 ENCOUNTER — Telehealth: Payer: Self-pay | Admitting: Gastroenterology

## 2016-10-14 NOTE — Telephone Encounter (Signed)
Pt is aware.  

## 2016-10-14 NOTE — Telephone Encounter (Signed)
Pt said she is scheduled for enteroscopy tomorrow. However, she is having pain in her upper abd, she states near her hiatal hernia.  She took 2 Tylenol and used heating pad last evening and it helped.  Had diarrhea x 3 last evening, but none today. Vicente Males, please advise!

## 2016-10-14 NOTE — Telephone Encounter (Signed)
Monitor symptoms. Follow bland diet this evening. Glad diarrhea has resolved. Sounds like pain improving. Has known gastritis. Keep plans unless clinically changes.

## 2016-10-14 NOTE — Telephone Encounter (Signed)
[phone] PLEASE CALL PATIENT, SHE HAS QUESTIONS BEFORE HER PROCEDURE TOMORROW.

## 2016-10-15 ENCOUNTER — Encounter (HOSPITAL_COMMUNITY): Payer: Self-pay | Admitting: *Deleted

## 2016-10-15 ENCOUNTER — Ambulatory Visit (HOSPITAL_COMMUNITY)
Admission: RE | Admit: 2016-10-15 | Discharge: 2016-10-15 | Disposition: A | Discharge status: Home / Self Care | Payer: Medicare HMO | Source: Ambulatory Visit | Attending: Gastroenterology | Admitting: Gastroenterology

## 2016-10-15 ENCOUNTER — Encounter (HOSPITAL_COMMUNITY)
Admission: RE | Disposition: A | Discharge status: Home / Self Care | Payer: Self-pay | Source: Ambulatory Visit | Attending: Gastroenterology

## 2016-10-15 ENCOUNTER — Ambulatory Visit (HOSPITAL_COMMUNITY): Payer: Medicare HMO | Admitting: Anesthesiology

## 2016-10-15 DIAGNOSIS — D500 Iron deficiency anemia secondary to blood loss (chronic): Secondary | ICD-10-CM | POA: Insufficient documentation

## 2016-10-15 DIAGNOSIS — K219 Gastro-esophageal reflux disease without esophagitis: Secondary | ICD-10-CM | POA: Insufficient documentation

## 2016-10-15 DIAGNOSIS — J449 Chronic obstructive pulmonary disease, unspecified: Secondary | ICD-10-CM | POA: Insufficient documentation

## 2016-10-15 DIAGNOSIS — E119 Type 2 diabetes mellitus without complications: Secondary | ICD-10-CM | POA: Insufficient documentation

## 2016-10-15 DIAGNOSIS — G4730 Sleep apnea, unspecified: Secondary | ICD-10-CM | POA: Insufficient documentation

## 2016-10-15 DIAGNOSIS — I2510 Atherosclerotic heart disease of native coronary artery without angina pectoris: Secondary | ICD-10-CM | POA: Diagnosis not present

## 2016-10-15 DIAGNOSIS — K5520 Angiodysplasia of colon without hemorrhage: Secondary | ICD-10-CM | POA: Diagnosis not present

## 2016-10-15 DIAGNOSIS — Z9989 Dependence on other enabling machines and devices: Secondary | ICD-10-CM | POA: Insufficient documentation

## 2016-10-15 DIAGNOSIS — I110 Hypertensive heart disease with heart failure: Secondary | ICD-10-CM | POA: Diagnosis not present

## 2016-10-15 DIAGNOSIS — Z87891 Personal history of nicotine dependence: Secondary | ICD-10-CM | POA: Insufficient documentation

## 2016-10-15 DIAGNOSIS — Z7982 Long term (current) use of aspirin: Secondary | ICD-10-CM | POA: Insufficient documentation

## 2016-10-15 DIAGNOSIS — Z7984 Long term (current) use of oral hypoglycemic drugs: Secondary | ICD-10-CM | POA: Diagnosis not present

## 2016-10-15 DIAGNOSIS — D649 Anemia, unspecified: Secondary | ICD-10-CM

## 2016-10-15 DIAGNOSIS — Z79899 Other long term (current) drug therapy: Secondary | ICD-10-CM | POA: Insufficient documentation

## 2016-10-15 DIAGNOSIS — K31819 Angiodysplasia of stomach and duodenum without bleeding: Secondary | ICD-10-CM | POA: Insufficient documentation

## 2016-10-15 DIAGNOSIS — K921 Melena: Secondary | ICD-10-CM

## 2016-10-15 DIAGNOSIS — E7800 Pure hypercholesterolemia, unspecified: Secondary | ICD-10-CM | POA: Diagnosis not present

## 2016-10-15 DIAGNOSIS — Z7983 Long term (current) use of bisphosphonates: Secondary | ICD-10-CM | POA: Insufficient documentation

## 2016-10-15 DIAGNOSIS — I509 Heart failure, unspecified: Secondary | ICD-10-CM | POA: Insufficient documentation

## 2016-10-15 HISTORY — PX: ENTEROSCOPY: SHX5533

## 2016-10-15 HISTORY — PX: HOT HEMOSTASIS: SHX5433

## 2016-10-15 LAB — GLUCOSE, CAPILLARY
Glucose-Capillary: 133 mg/dL — ABNORMAL HIGH (ref 65–99)
Glucose-Capillary: 189 mg/dL — ABNORMAL HIGH (ref 65–99)

## 2016-10-15 LAB — VITAMIN B12: Vitamin B-12: 233 pg/mL (ref 180–914)

## 2016-10-15 LAB — FERRITIN: Ferritin: 61 ng/mL (ref 11–307)

## 2016-10-15 SURGERY — ENTEROSCOPY
Anesthesia: Monitor Anesthesia Care

## 2016-10-15 MED ORDER — MIDAZOLAM HCL 2 MG/2ML IJ SOLN
1.0000 mg | INTRAMUSCULAR | Status: AC
  Administered 2016-10-15: 2 mg via INTRAVENOUS

## 2016-10-15 MED ORDER — PROPOFOL 10 MG/ML IV BOLUS
INTRAVENOUS | Status: AC
  Filled 2016-10-15: qty 20

## 2016-10-15 MED ORDER — LIDOCAINE VISCOUS 2 % MT SOLN
5.0000 mL | Freq: Two times a day (BID) | OROMUCOSAL | Status: AC
  Administered 2016-10-15 (×2): 5 mL via OROMUCOSAL

## 2016-10-15 MED ORDER — LIDOCAINE HCL (PF) 1 % IJ SOLN
INTRAMUSCULAR | Status: DC | PRN
  Administered 2016-10-15: 40 mg

## 2016-10-15 MED ORDER — LIDOCAINE VISCOUS 2 % MT SOLN
OROMUCOSAL | Status: AC
  Filled 2016-10-15: qty 15

## 2016-10-15 MED ORDER — LACTATED RINGERS IV SOLN
INTRAVENOUS | Status: DC
  Administered 2016-10-15: 1000 mL via INTRAVENOUS

## 2016-10-15 MED ORDER — PHENYLEPHRINE HCL 10 MG/ML IJ SOLN
INTRAMUSCULAR | Status: DC | PRN
  Administered 2016-10-15 (×2): 80 ug via INTRAVENOUS

## 2016-10-15 MED ORDER — FENTANYL CITRATE (PF) 100 MCG/2ML IJ SOLN
25.0000 ug | Freq: Once | INTRAMUSCULAR | Status: AC
  Administered 2016-10-15: 25 ug via INTRAVENOUS

## 2016-10-15 MED ORDER — CHLORHEXIDINE GLUCONATE CLOTH 2 % EX PADS: 6.0000 | MEDICATED_PAD | Freq: Once | CUTANEOUS | Status: DC

## 2016-10-15 MED ORDER — PROPOFOL 500 MG/50ML IV EMUL
INTRAVENOUS | Status: DC | PRN
  Administered 2016-10-15: 150 ug/kg/min via INTRAVENOUS

## 2016-10-15 MED ORDER — GLUCAGON HCL RDNA (DIAGNOSTIC) 1 MG IJ SOLR
INTRAMUSCULAR | Status: DC | PRN
  Administered 2016-10-15: .5 mg via INTRAVENOUS

## 2016-10-15 MED ORDER — LIDOCAINE HCL (PF) 1 % IJ SOLN
INTRAMUSCULAR | Status: AC
  Filled 2016-10-15: qty 5

## 2016-10-15 MED ORDER — FENTANYL CITRATE (PF) 100 MCG/2ML IJ SOLN
INTRAMUSCULAR | Status: AC
  Filled 2016-10-15: qty 2

## 2016-10-15 MED ORDER — PHENYLEPHRINE HCL 10 MG/ML IJ SOLN
INTRAMUSCULAR | Status: AC
  Filled 2016-10-15: qty 1

## 2016-10-15 MED ORDER — MIDAZOLAM HCL 2 MG/2ML IJ SOLN
INTRAMUSCULAR | Status: AC
  Filled 2016-10-15: qty 2

## 2016-10-15 MED ORDER — GLUCAGON HCL RDNA (DIAGNOSTIC) 1 MG IJ SOLR
INTRAMUSCULAR | Status: AC
  Filled 2016-10-15: qty 1

## 2016-10-15 NOTE — Discharge Instructions (Signed)
I saw THE RED SPOTS(AVMs) IN YOUR SMALL BOWEL. I PUT HEAT HEAT ON THEM TO MAKE THEM DISAPPEAR.   I WILL CHECK YOUR VITAMIN LEVELS(B12, FOLATE, FERRITIN) TODAY.  FOLLOW A low fat diet.  SEE INFO BELOW.  AVOID ITEMS THAT CAUSE BLOATING & GAS. SEE INFO BELOW.   FOLLOW UP IN JAN 2019.  UPPER ENDOSCOPY AFTER CARE Read the instructions outlined below and refer to this sheet in the next week. These discharge instructions provide you with general information on caring for yourself after you leave the hospital. While your treatment has been planned according to the most current medical practices available, unavoidable complications occasionally occur. If you have any problems or questions after discharge, call DR. Charnese Federici, [phone].  ACTIVITY  You may resume your regular activity, but move at a slower pace for the next 24 hours.   Take frequent rest periods for the next 24 hours.   Walking will help get rid of the air and reduce the bloated feeling in your belly (abdomen).   No driving for 24 hours (because of the medicine (anesthesia) used during the test).   You may shower.   Do not sign any important legal documents or operate any machinery for 24 hours (because of the anesthesia used during the test).    NUTRITION  Drink plenty of fluids.   You may resume your normal diet as instructed by your doctor.   Begin with a light meal and progress to your normal diet. Heavy or fried foods are harder to digest and may make you feel sick to your stomach (nauseated).   Avoid alcoholic beverages for 24 hours or as instructed.    MEDICATIONS  You may resume your normal medications.   WHAT YOU CAN EXPECT TODAY  Some feelings of bloating in the abdomen.   Passage of more gas than usual.    IF YOU HAD A BIOPSY TAKEN DURING THE UPPER ENDOSCOPY:  Eat a soft diet IF YOU HAVE NAUSEA, BLOATING, ABDOMINAL PAIN, OR VOMITING.    FINDING OUT THE RESULTS OF YOUR TEST Not all test  results are available during your visit. DR. Oneida Alar WILL CALL YOU WITHIN 7 DAYS OF YOUR PROCEDUE WITH YOUR RESULTS. Do not assume everything is normal if you have not heard from DR. Maizey Menendez IN ONE WEEK, CALL HER OFFICE AT [phone].  SEEK IMMEDIATE MEDICAL ATTENTION AND CALL THE OFFICE: [phone] IF:  You have more than a spotting of blood in your stool.   Your belly is swollen (abdominal distention).   You are nauseated or vomiting.   You have a temperature over 101F.   You have abdominal pain or discomfort that is severe or gets worse throughout the day.   Arteriovenous Malformation An arteriovenous malformation (AVM) is a disorder that CAUSE BLOOD VESSELS TO BE ON THE SURFACE OF YOUR GI TRACT. It is characterized by a complex, tangled web of arteries and veins THAT HAVE A TENDENCY TO BLEED. An AVM may occur in the STOMACH, COLON, OR SMALL BOWEL.  SYMPTOMS  The most common problems (symptoms) of AVM include:  Bleeding (hemorrhaging).   ANEMIA  TREATMENT  The treatment for AVMs: **ABLATION WITH HEAT             BLOATING AND GAS PREVENTION  Although gas may be uncomfortable and embarrassing, it is not life-threatening. Understanding causes, ways to reduce symptoms, and treatment will help most people find some relief.  Points to remember  Everyone has gas in the digestive tract.  People often believe normal passage of gas to be excessive.  Gas comes from two main sources: swallowed air and normal breakdown of certain foods by harmless bacteria naturally present in the large intestine.  Many foods with carbohydrates can cause gas. Fats and proteins cause little gas.  Foods that may cause gas include o beans  o vegetables, such as broccoli, cabbage, brussels sprouts, onions, artichokes, and asparagus  o fruits, such as pears, apples, and peaches  o whole grains, such as whole wheat and bran  o soft drinks and fruit drinks  o milk and milk products, such  as cheese and ice cream, and packaged foods prepared with lactose, such as bread, cereal, and salad dressing  o foods containing sorbitol, such as dietetic foods and sugar free candies and gums   The most common symptoms of gas are belching, flatulence, bloating, and abdominal pain. However, some of these symptoms are often caused by an intestinal disorder, such as irritable bowel syndrome, rather than too much gas.  The most common ways to reduce the discomfort of gas are changing diet, taking nonprescription medicines, and reducing the amount of air swallowed.  Digestive enzymes, such as lactase supplements, actually help digest carbohydrates and may allow people to eat foods that normally cause gas.    Low-Fat Diet BREADS, CEREALS, PASTA, RICE, DRIED PEAS, AND BEANS These products are high in carbohydrates and most are low in fat. Therefore, they can be increased in the diet as substitutes for fatty foods. They too, however, contain calories and should not be eaten in excess. Cereals can be eaten for snacks as well as for breakfast.  Include foods that contain fiber (fruits, vegetables, whole grains, and legumes). Research shows that fiber may lower blood cholesterol levels, especially the water-soluble fiber found in fruits, vegetables, oat products, and legumes. FRUITS AND VEGETABLES It is good to eat fruits and vegetables. Besides being sources of fiber, both are rich in vitamins and some minerals. They help you get the daily allowances of these nutrients. Fruits and vegetables can be used for snacks and desserts. MEATS Limit lean meat, chicken, [place], and fish to no more than 6 ounces per day. Beef, Pork, and Lamb Use lean cuts of beef, pork, and lamb. Lean cuts include:  Extra-lean ground beef.  Arm roast.  Sirloin tip.  Center-cut ham.  Round steak.  Loin chops.  Rump roast.  Tenderloin.  Trim all fat off the outside of meats before cooking. It is not necessary to severely  decrease the intake of red meat, but lean choices should be made. Lean meat is rich in protein and contains a highly absorbable form of iron. Premenopausal women, in particular, should avoid reducing lean red meat because this could increase the risk for low red blood cells (iron-deficiency anemia).  Chicken and [place] These are good sources of protein. The fat of poultry can be reduced by removing the skin and underlying fat layers before cooking. Chicken and [place] can be substituted for lean red meat in the diet. Poultry should not be fried or covered with high-fat sauces. Fish and Shellfish Fish is a good source of protein. Shellfish contain cholesterol, but they usually are low in saturated fatty acids. The preparation of fish is important. Like chicken and [place], they should not be fried or covered with high-fat sauces. EGGS Egg whites contain no fat or cholesterol. They can be eaten often. Try 1 to 2 egg whites instead of whole eggs in recipes or use egg  substitutes that do not contain yolk.  MILK AND DAIRY PRODUCTS Use skim or 1% milk instead of 2% or whole milk. Decrease whole milk, natural, and processed cheeses. Use nonfat or low-fat (2%) cottage cheese or low-fat cheeses made from vegetable oils. Choose nonfat or low-fat (1 to 2%) yogurt. Experiment with evaporated skim milk in recipes that call for heavy cream. Substitute low-fat yogurt or low-fat cottage cheese for sour cream in dips and salad dressings. Have at least 2 servings of low-fat dairy products, such as 2 glasses of skim (or 1%) milk each day to help get your daily calcium intake.  FATS AND OILS Butterfat, lard, and beef fats are high in saturated fat and cholesterol. These should be avoided.Vegetable fats do not contain cholesterol. AVOID coconut oil, palm oil, and palm kernel oil, WHICH are very high in saturated fats. These should be limited. These fats are often used in bakery goods, processed foods, popcorn, oils, and  nondairy creamers. Vegetable shortenings and some peanut butters contain hydrogenated oils, which are also saturated fats. Read the labels on these foods and check for saturated vegetable oils.  Desirable liquid vegetable oils are corn oil, cottonseed oil, olive oil, canola oil, safflower oil, soybean oil, and sunflower oil. Peanut oil is not as good, but small amounts are acceptable. Buy a heart-healthy tub margarine that has no partially hydrogenated oils in the ingredients. AVOID Mayonnaise and salad dressings often are made from unsaturated fats.  OTHER EATING TIPS Snacks  Most sweets should be limited as snacks. They tend to be rich in calories and fats, and their caloric content outweighs their nutritional value. Some good choices in snacks are graham crackers, melba toast, soda crackers, bagels (no egg), English muffins, fruits, and vegetables. These snacks are preferable to snack crackers, [place] fries, and chips. Popcorn should be air-popped or cooked in small amounts of liquid vegetable oil.  Desserts Eat fruit, low-fat yogurt, and fruit ices instead of pastries, cake, and cookies. Sherbet, angel food cake, gelatin dessert, frozen low-fat yogurt, or other frozen products that do not contain saturated fat (pure fruit juice bars, frozen ice pops) are also acceptable.   COOKING METHODS Choose those methods that use little or no fat. They include: Poaching.  Braising.  Steaming.  Grilling.  Baking.  Stir-frying.  Broiling.  Microwaving.  Foods can be cooked in a nonstick pan without added fat, or use a nonfat cooking spray in regular cookware. Limit fried foods and avoid frying in saturated fat. Add moisture to lean meats by using water, broth, cooking wines, and other nonfat or low-fat sauces along with the cooking methods mentioned above. Soups and stews should be chilled after cooking. The fat that forms on top after a few hours in the refrigerator should be skimmed off. When preparing  meals, avoid using excess salt. Salt can contribute to raising blood pressure in some people.  EATING AWAY FROM HOME Order entres, potatoes, and vegetables without sauces or butter. When meat exceeds the size of a deck of cards (3 to 4 ounces), the rest can be taken home for another meal. Choose vegetable or fruit salads and ask for low-calorie salad dressings to be served on the side. Use dressings sparingly. Limit high-fat toppings, such as bacon, crumbled eggs, cheese, sunflower seeds, and olives. Ask for heart-healthy tub margarine instead of butter.

## 2016-10-15 NOTE — Transfer of Care (Signed)
Immediate Anesthesia Transfer of Care Note  Patient: Christine Bolton  Procedure(s) Performed: ENTEROSCOPY WITH PROPOFOL (N/A ) HOT HEMOSTASIS (ARGON PLASMA COAGULATION/BICAP)  Patient Location: PACU  Anesthesia Type:MAC  Level of Consciousness: awake, alert , oriented and patient cooperative  Airway & Oxygen Therapy: Patient Spontanous Breathing and Patient connected to nasal cannula oxygen  Post-op Assessment: Report given to RN and Post -op Vital signs reviewed and stable  Post vital signs: Reviewed and stable  Last Vitals:  Vitals:   10/15/16 0900 10/15/16 0905  BP: 137/64 136/74  Pulse:    Resp: 17 16  Temp:    SpO2: 98% 98%    Last Pain:  Vitals:   10/15/16 0807  TempSrc: Oral  PainSc: 5       Patients Stated Pain Goal: 7 (00/93/81 8299)  Complications: No apparent anesthesia complications

## 2016-10-15 NOTE — Op Note (Signed)
[facility] Patient Name: Christine Bolton Procedure Date: 10/15/2016 9:01 AM MRN: [id number] Date of Birth: 1945/05/12 Attending MD: Barney Drain MD, MD CSN: [id number] Age: 71 Admit Type: Outpatient Procedure:                Small bowel enteroscopy WITH APC Indications:              Iron deficiency anemia secondary to chronic blood                            loss, Melena-AUG 2018; Hb 9.7 MCV 79.5, OCT 2018 Hb                            10.1 MCV 80 Providers:                Barney Drain MD, MD, Otis Peak B. Sharon Seller, RN, Rosina Lowenstein, RN Referring MD:             Keane Police, MD Medicines:                Propofol per Anesthesia Complications:            No immediate complications. Estimated Blood Loss:     Estimated blood loss was minimal. Procedure:                Pre-Anesthesia Assessment:                           - Prior to the procedure, a History and Physical                            was performed, and patient medications and                            allergies were reviewed. The patient's tolerance of                            previous anesthesia was also reviewed. The risks                            and benefits of the procedure and the sedation                            options and risks were discussed with the patient.                            All questions were answered, and informed consent                            was obtained. Prior Anticoagulants: The patient has                            taken aspirin, last dose was 1 day prior to  procedure. ASA Grade Assessment: II - A patient                            with mild systemic disease. After reviewing the                            risks and benefits, the patient was deemed in                            satisfactory condition to undergo the procedure.                            After obtaining informed consent, the endoscope was     passed under direct vision. Throughout the                            procedure, the patient's blood pressure, pulse, and                            oxygen saturations were monitored continuously. The                            [id number] ([id number]) scope was introduced through                            the mouth and advanced to the proximal jejunum. The                            small bowel enteroscopy was accomplished without                            difficulty. The patient tolerated the procedure                            well. Scope In: 9:21:40 AM Scope Out: 9:38:10 AM Total Procedure Duration: 0 hours 16 minutes 30 seconds  Findings:      The esophagus was normal.      The stomach was normal.      A single angioectasia with no bleeding was found in the second portion       of the duodenum. Coagulation for bleeding prevention using argon plasma       at 0.5 liters/minute and 20 watts was successful. Estimated blood loss       was minimal.      Two angioectasias with no bleeding were found in the proximal jejunum.       Coagulation for bleeding prevention using argon plasma at 0.5       liters/minute and 20 watts was successful. Impression:               - Normal esophagus.                           - Normal stomach.                           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                            Treated with argon plasma coagulation (APC). Moderate Sedation:      Per Anesthesia Care Recommendation:           - Resume previous diet. CHECK FERRITIN, VITAMIN                            B12, AND RBC FOLATE TODAY.                           - Continue present medications. CONTINUE PO IRON.                            CONSIDER BENEFITS V. RISKS OF CONTINUED ASA USE IF                            PT CONTINUES WITH A TRANSFUSION DEPENDENT ANEMIA.                           - Return to my office in 3 months.                           - Patient has a contact number  available for                            emergencies. The signs and symptoms of potential                            delayed complications were discussed with the                            patient. Return to normal activities tomorrow.                            Written discharge instructions were provided to the                            patient. Procedure Code(s):        --- Professional ---                           [phone], Small intestinal endoscopy, enteroscopy                            beyond second portion of duodenum, not including                            ileum; with control of bleeding (eg, injection,                            bipolar cautery, unipolar cautery, laser, heater  probe, stapler, plasma coagulator) Diagnosis Code(s):        --- Professional ---                           Z61.096, Angiodysplasia of stomach and duodenum                            without bleeding                           K55.20, Angiodysplasia of colon without hemorrhage                           D50.0, Iron deficiency anemia secondary to blood                            loss (chronic)                           K92.1, Melena (includes Hematochezia) CPT copyright 2016 [employer]. All rights reserved. The codes documented in this report are preliminary and upon coder review may  be revised to meet current compliance requirements. Barney Drain, MD Barney Drain MD, MD 10/15/2016 9:53:52 AM This report has been signed electronically. Number of Addenda: 0

## 2016-10-15 NOTE — Anesthesia Preprocedure Evaluation (Signed)
Anesthesia Evaluation  Patient identified by MRN, date of birth, ID band Patient awake    Reviewed: Allergy & Precautions, NPO status , Patient's Chart, lab work & pertinent test results  Airway Mallampati: II  TM Distance: >3 FB Neck ROM: Full    Dental  (+) Teeth Intact, Partial Upper   Pulmonary shortness of breath and with exertion, asthma , sleep apnea and Continuous Positive Airway Pressure Ventilation , COPD,  COPD inhaler, former smoker,    Pulmonary exam normal breath sounds clear to auscultation       Cardiovascular hypertension, Pt. on medications + CAD, +CHF and + DOE   Rhythm:Regular Rate:Normal     Neuro/Psych  Neuromuscular disease    GI/Hepatic GERD  ,  Endo/Other  diabetes, Type 2  Renal/GU      Musculoskeletal  (+) Arthritis ,   Abdominal   Peds  Hematology  (+) Blood dyscrasia, anemia ,   Anesthesia Other Findings   Reproductive/Obstetrics                             Anesthesia Physical Anesthesia Plan  ASA: III  Anesthesia Plan: MAC   Post-op Pain Management:    Induction: Intravenous  PONV Risk Score and Plan:   Airway Management Planned: Simple Face Mask  Additional Equipment:   Intra-op Plan:   Post-operative Plan:   Informed Consent: I have reviewed the patients History and Physical, chart, labs and discussed the procedure including the risks, benefits and alternatives for the proposed anesthesia with the patient or authorized representative who has indicated his/her understanding and acceptance.     Plan Discussed with:   Anesthesia Plan Comments:         Anesthesia Quick Evaluation

## 2016-10-15 NOTE — H&P (Signed)
Primary Care Physician:  Keane Police, MD Primary Gastroenterologist:  Dr. Oneida Alar  Pre-Procedure History & Physical: HPI:  Christine Bolton is a 71 y.o. female here for intermittent MELENA/brb IN SMALL INTESTINES on GIVENS SEP 2018/NORMOCYTIC ANEMIA(HB 10.7 MCV 80 OCT 2018).   Past Medical History:  Diagnosis Date  . Arthritis   . Asthma   . CHF (congestive heart failure) ([place])   . Constipation   . COPD (chronic obstructive pulmonary disease) ([place])   . Coronary artery disease   . Diabetes ([place])   . Dyspnea    exertion  . GERD (gastroesophageal reflux disease)   . Hemolytic anemia ([place])   . HTN (hypertension)   . Hypercholesterolemia   . IDA (iron deficiency anemia)   . Neuromuscular disorder ([place])   . Sleep apnea    uses bipap    Past Surgical History:  Procedure Laterality Date  . BACK SURGERY    . BIOPSY  08/13/2016   Procedure: BIOPSY;  Surgeon: Danie Binder, MD;  [facility];  Service: Endoscopy;;  gastric duodenal  . CAROTID ENDARTERECTOMY    . COLONOSCOPY  2014   normal (?) she is not sure  . COLONOSCOPY WITH PROPOFOL N/A 08/13/2016   Procedure: COLONOSCOPY WITH PROPOFOL;  Surgeon: Danie Binder, MD;  [facility];  Service: Endoscopy;  Laterality: N/A;  9:15am  . ECTOPIC PREGNANCY SURGERY    . ESOPHAGOGASTRODUODENOSCOPY (EGD) WITH PROPOFOL N/A 08/13/2016   Procedure: ESOPHAGOGASTRODUODENOSCOPY (EGD) WITH PROPOFOL;  Surgeon: Danie Binder, MD;  [facility];  Service: Endoscopy;  Laterality: N/A;  . fibroid tumor    . GIVENS CAPSULE STUDY N/A 09/10/2016   Procedure: GIVENS CAPSULE STUDY;  Surgeon: Danie Binder, MD;  [facility];  Service: Endoscopy;  Laterality: N/A;  . lower extremity bypass    . PARTIAL HYSTERECTOMY    . POLYPECTOMY  08/13/2016   Procedure: POLYPECTOMY;  Surgeon: Danie Binder, MD;  [facility];  Service: Endoscopy;;  colon     Prior to Admission medications   Medication Sig  Start Date End Date Taking? Authorizing Provider  Albuterol Sulfate 108 (90 Base) MCG/ACT AEPB Inhale 1 puff into the lungs 2 (two) times daily as needed (wheezing).    Yes [provider]  alendronate (FOSAMAX) 70 MG tablet Take 70 mg by mouth once a week. Every Tuesday 05/11/16  Yes [provider]  aspirin EC 81 MG tablet Take 81 mg by mouth daily.   Yes [provider]  atorvastatin (LIPITOR) 40 MG tablet Take 1 tablet by mouth daily. 04/17/11  Yes [provider]  Calcium Carbonate-Vitamin D (CALTRATE 600+D) 600-400 MG-UNIT tablet Take 1 tablet by mouth daily.   Yes [provider]  carvedilol (COREG) 6.25 MG tablet Take 6.25 mg by mouth every 12 (twelve) hours.    Yes [provider]  ferrous sulfate 325 (65 FE) MG EC tablet Take 325 mg by mouth 2 (two) times daily.   Yes [provider]  folic acid (FOLVITE) 1 MG tablet Take 1 tablet by mouth daily. 04/16/11  Yes [provider]  hydrOXYzine (VISTARIL) 25 MG capsule Take 25 mg by mouth at bedtime.   Yes [provider]  INCRUSE ELLIPTA 62.5 MCG/INH AEPB INHALE 1 PUFF PO bid 05/01/16  Yes [provider]  ipratropium (ATROVENT) 0.06 % nasal spray INSTILL 1 SPRAY IN BOTH NOSTRILS Q 4 HOURS PRN 06/14/16  Yes [provider]  ipratropium-albuterol (DUONEB) 0.5-2.5 (3) MG/3ML SOLN Inhale 3 mLs into the lungs daily as needed (WHEEZING, SHORTNESS OF BREATH).  10/02/16  Yes [provider]  LINZESS 145 MCG CAPS capsule Take 145 mcg by mouth daily as needed (IBS, constipation).  08/07/16  Yes [provider]  losartan (COZAAR) 100 MG tablet Take 100 mg by mouth daily.   Yes [provider]  MAGNESIUM-OXIDE 400 (241.3 Mg) MG tablet TAKE 1 TABLET BY MOUTH TWICE DAILY 06/27/16  Yes [provider]  metFORMIN (GLUCOPHAGE) 500 MG tablet Take 500 mg by mouth daily (with breakfast). Take with meal    Yes [provider]   montelukast (SINGULAIR) 10 MG tablet Take 1 tablet in the evening 06/24/16  Yes [provider]  olopatadine (PATANOL) 0.1 % ophthalmic solution Place 1 drop into both eyes at bedtime.   Yes [provider]  omeprazole (PRILOSEC) 20 MG capsule Take 1 capsule (20 mg total) by mouth daily. 30 minutes before breakfast 07/17/16  Yes Annitta Needs, NP  predniSONE (DELTASONE) 10 MG tablet Take 10-20 mg by mouth See admin instructions. Take 2 tablets by mouth three times a day for 3 days then take 2 tablets by mouth twice a day for 3 days then take 1 tablet by mouth twice a day for 3 days and then take 1 tablet by mouth once daily 10/02/16  Yes [provider]  PRESCRIPTION MEDICATION Take 1 tablet by mouth daily. Domperidone 10 mg for a.   Yes [provider]  promethazine-dextromethorphan (PROMETHAZINE-DM) 6.25-15 MG/5ML syrup Take 10 mLs by mouth 2 (two) times daily as needed for cough.  09/04/16  Yes [provider]  spironolactone (ALDACTONE) 25 MG tablet Take 1 tablet by mouth daily. 04/22/16  Yes [provider]  tiZANidine (ZANAFLEX) 4 MG tablet Take 4 mg by mouth at bedtime.    Yes [provider]  topiramate (TOPAMAX) 25 MG capsule Take 75 mg by mouth at bedtime.   Yes [provider]  traMADol (ULTRAM) 50 MG tablet Take 50 mg by mouth TWICE DAILY   Yes [provider]  traZODone (DESYREL) 50 MG tablet Take 50 mg by mouth at bedtime. 08/23/16  Yes [provider]  nitroGLYCERIN (NITROSTAT) 0.4 MG SL tablet Place 0.4 mg under the tongue every 5 (five) minutes as needed for chest pain.    [provider]  Topiramate ER (QUDEXY XR) 25 MG CS24 sprinkle cap Take 2 capsules by mouth at bedtime.    [provider]    Allergies as of 09/23/2016 - Review Complete 08/13/2016  Allergen Reaction Noted  . Levofloxacin Other (See Comments) 04/12/2011    Family History  Problem Relation Age of Onset  .  Colon cancer Neg Hx        no first degree relatives. States cousin had colon cancer  . Colon polyps Neg Hx     Social History   Social History  . Marital status: Divorced    Spouse name: N/A  . Number of children: N/A  . Years of education: N/A   Occupational History  . Not on file.   Social History Main Topics  . Smoking status: Former Smoker    Types: Cigarettes    Quit date: 2002  . Smokeless tobacco: Never Used  . Alcohol use No  . Drug use: No  . Sexual activity: Not on file   Other Topics Concern  . Not on file   Social History Narrative  .  No narrative on file    Review of Systems: See HPI, otherwise negative ROS   Physical Exam: BP 134/72   Pulse 73   Temp 98.1 F (36.7 C) (Oral)   Resp 15   Ht 5\' 3"  (1.6 m)   Wt 189 lb (85.7 kg)   SpO2 96%   BMI 33.48 kg/m  General:   Alert,  pleasant and cooperative in NAD Head:  Normocephalic and atraumatic. Neck:  Supple; Lungs:  Clear throughout to auscultation.    Heart:  Regular rate and rhythm. Abdomen:  Soft, nontender and nondistended. Normal bowel sounds, without guarding, and without rebound.   Neurologic:  Alert and  oriented x4;  grossly normal neurologically.  Impression/Plan:     MELENA/brb IN SMALL INTESTINES  PLAN: 1. ENTEROSCOPY TODAY DISCUSSED PROCEDURE, BENEFITS, & RISKS: < 1% chance of medication reaction, bleeding, perforation, or rupture of spleen/liver.

## 2016-10-15 NOTE — Anesthesia Postprocedure Evaluation (Signed)
Anesthesia Post Note  Patient: Christine Bolton  Procedure(s) Performed: ENTEROSCOPY WITH PROPOFOL (N/A ) HOT HEMOSTASIS (ARGON PLASMA COAGULATION/BICAP)  Patient location during evaluation: PACU Anesthesia Type: MAC Level of consciousness: awake, awake and alert, oriented and patient cooperative Pain management: pain level controlled Vital Signs Assessment: post-procedure vital signs reviewed and stable Respiratory status: spontaneous breathing, nonlabored ventilation, respiratory function stable and patient connected to nasal cannula oxygen Cardiovascular status: stable Postop Assessment: no apparent nausea or vomiting Anesthetic complications: no     Last Vitals:  Vitals:   10/15/16 0900 10/15/16 0905  BP: 137/64 136/74  Pulse:    Resp: 17 16  Temp:    SpO2: 98% 98%    Last Pain:  Vitals:   10/15/16 0807  TempSrc: Oral  PainSc: 5                  Shahzain Kiester L

## 2016-10-16 LAB — FOLATE RBC
FOLATE, HEMOLYSATE: 594.5 ng/mL
FOLATE, RBC: 1813 ng/mL (ref >498)
HEMATOCRIT: 32.8 % — AB (ref 34.0–46.6)

## 2016-10-17 ENCOUNTER — Encounter (HOSPITAL_COMMUNITY): Payer: Self-pay | Admitting: Gastroenterology

## 2016-10-30 ENCOUNTER — Other Ambulatory Visit: Payer: Self-pay | Admitting: General Practice

## 2016-10-30 DIAGNOSIS — D649 Anemia, unspecified: Secondary | ICD-10-CM

## 2016-10-30 NOTE — Progress Notes (Signed)
I tried to call the patient, no answer,lmom   Order entered for repeat labs next week and in 12/2016

## 2016-10-31 NOTE — Progress Notes (Signed)
I spoke with the patient and she had her CBC rechecked yesterday by her oncologist.  She will have her labs drawn in December 2018.  She has an appt to see Dr. Franchot Heidelberg in 11/2016.   Routing to Dr. Oneida Alar as a Juluis Rainier

## 2016-11-20 ENCOUNTER — Telehealth: Payer: Self-pay | Admitting: Gastroenterology

## 2016-11-20 ENCOUNTER — Other Ambulatory Visit: Payer: Self-pay

## 2016-11-20 DIAGNOSIS — D649 Anemia, unspecified: Secondary | ICD-10-CM

## 2016-11-20 NOTE — Telephone Encounter (Signed)
Patient on dec recall for CBC

## 2016-11-20 NOTE — Telephone Encounter (Signed)
Letter mailed with orders today.

## 2016-12-03 ENCOUNTER — Encounter: Payer: Self-pay | Admitting: Gastroenterology

## 2016-12-03 ENCOUNTER — Ambulatory Visit: Payer: Medicare HMO | Admitting: Gastroenterology

## 2016-12-03 ENCOUNTER — Ambulatory Visit (HOSPITAL_COMMUNITY)
Admission: RE | Admit: 2016-12-03 | Discharge: 2016-12-03 | Disposition: A | Discharge status: Home / Self Care | Payer: Medicare HMO | Source: Ambulatory Visit | Attending: Gastroenterology | Admitting: Gastroenterology

## 2016-12-03 ENCOUNTER — Encounter (HOSPITAL_COMMUNITY): Payer: Self-pay | Admitting: *Deleted

## 2016-12-03 ENCOUNTER — Inpatient Hospital Stay (HOSPITAL_COMMUNITY)
Admission: EM | Admit: 2016-12-03 | Discharge: 2016-12-07 | DRG: 389 | Disposition: A | Discharge status: Home / Self Care | Payer: Medicare HMO | Attending: Family Medicine | Admitting: Family Medicine

## 2016-12-03 ENCOUNTER — Other Ambulatory Visit (HOSPITAL_COMMUNITY)
Admission: RE | Admit: 2016-12-03 | Discharge: 2016-12-03 | Disposition: A | Discharge status: Home / Self Care | Payer: Medicare HMO | Source: Ambulatory Visit | Attending: Gastroenterology | Admitting: Gastroenterology

## 2016-12-03 VITALS — BP 156/71 | HR 67 | Temp 98.4°F | Ht 63.0 in | Wt 189.8 lb

## 2016-12-03 DIAGNOSIS — K56609 Unspecified intestinal obstruction, unspecified as to partial versus complete obstruction: Secondary | ICD-10-CM | POA: Diagnosis present

## 2016-12-03 DIAGNOSIS — Z9989 Dependence on other enabling machines and devices: Secondary | ICD-10-CM

## 2016-12-03 DIAGNOSIS — E876 Hypokalemia: Secondary | ICD-10-CM | POA: Diagnosis not present

## 2016-12-03 DIAGNOSIS — R109 Unspecified abdominal pain: Secondary | ICD-10-CM | POA: Diagnosis not present

## 2016-12-03 DIAGNOSIS — I708 Atherosclerosis of other arteries: Secondary | ICD-10-CM

## 2016-12-03 DIAGNOSIS — K5520 Angiodysplasia of colon without hemorrhage: Secondary | ICD-10-CM | POA: Diagnosis present

## 2016-12-03 DIAGNOSIS — K56600 Partial intestinal obstruction, unspecified as to cause: Principal | ICD-10-CM | POA: Diagnosis present

## 2016-12-03 DIAGNOSIS — J449 Chronic obstructive pulmonary disease, unspecified: Secondary | ICD-10-CM | POA: Diagnosis present

## 2016-12-03 DIAGNOSIS — R197 Diarrhea, unspecified: Secondary | ICD-10-CM | POA: Insufficient documentation

## 2016-12-03 DIAGNOSIS — Z87891 Personal history of nicotine dependence: Secondary | ICD-10-CM

## 2016-12-03 DIAGNOSIS — E7800 Pure hypercholesterolemia, unspecified: Secondary | ICD-10-CM | POA: Diagnosis present

## 2016-12-03 DIAGNOSIS — D589 Hereditary hemolytic anemia, unspecified: Secondary | ICD-10-CM | POA: Diagnosis present

## 2016-12-03 DIAGNOSIS — Z7982 Long term (current) use of aspirin: Secondary | ICD-10-CM

## 2016-12-03 DIAGNOSIS — D649 Anemia, unspecified: Secondary | ICD-10-CM

## 2016-12-03 DIAGNOSIS — I2510 Atherosclerotic heart disease of native coronary artery without angina pectoris: Secondary | ICD-10-CM | POA: Diagnosis present

## 2016-12-03 DIAGNOSIS — K429 Umbilical hernia without obstruction or gangrene: Secondary | ICD-10-CM

## 2016-12-03 DIAGNOSIS — D509 Iron deficiency anemia, unspecified: Secondary | ICD-10-CM | POA: Diagnosis present

## 2016-12-03 DIAGNOSIS — R1084 Generalized abdominal pain: Secondary | ICD-10-CM | POA: Diagnosis not present

## 2016-12-03 DIAGNOSIS — E785 Hyperlipidemia, unspecified: Secondary | ICD-10-CM | POA: Diagnosis present

## 2016-12-03 DIAGNOSIS — Z881 Allergy status to other antibiotic agents status: Secondary | ICD-10-CM

## 2016-12-03 DIAGNOSIS — Z7951 Long term (current) use of inhaled steroids: Secondary | ICD-10-CM

## 2016-12-03 DIAGNOSIS — Z79899 Other long term (current) drug therapy: Secondary | ICD-10-CM

## 2016-12-03 DIAGNOSIS — K31819 Angiodysplasia of stomach and duodenum without bleeding: Secondary | ICD-10-CM | POA: Diagnosis present

## 2016-12-03 DIAGNOSIS — K5900 Constipation, unspecified: Secondary | ICD-10-CM | POA: Diagnosis present

## 2016-12-03 DIAGNOSIS — M1990 Unspecified osteoarthritis, unspecified site: Secondary | ICD-10-CM | POA: Diagnosis present

## 2016-12-03 DIAGNOSIS — I509 Heart failure, unspecified: Secondary | ICD-10-CM | POA: Diagnosis present

## 2016-12-03 DIAGNOSIS — E119 Type 2 diabetes mellitus without complications: Secondary | ICD-10-CM | POA: Diagnosis present

## 2016-12-03 DIAGNOSIS — Z7984 Long term (current) use of oral hypoglycemic drugs: Secondary | ICD-10-CM

## 2016-12-03 DIAGNOSIS — Z79891 Long term (current) use of opiate analgesic: Secondary | ICD-10-CM

## 2016-12-03 DIAGNOSIS — K219 Gastro-esophageal reflux disease without esophagitis: Secondary | ICD-10-CM | POA: Diagnosis present

## 2016-12-03 DIAGNOSIS — G4730 Sleep apnea, unspecified: Secondary | ICD-10-CM | POA: Diagnosis present

## 2016-12-03 DIAGNOSIS — I110 Hypertensive heart disease with heart failure: Secondary | ICD-10-CM | POA: Diagnosis present

## 2016-12-03 DIAGNOSIS — I10 Essential (primary) hypertension: Secondary | ICD-10-CM | POA: Diagnosis present

## 2016-12-03 DIAGNOSIS — Z7983 Long term (current) use of bisphosphonates: Secondary | ICD-10-CM

## 2016-12-03 DIAGNOSIS — N281 Cyst of kidney, acquired: Secondary | ICD-10-CM | POA: Insufficient documentation

## 2016-12-03 DIAGNOSIS — I700 Atherosclerosis of aorta: Secondary | ICD-10-CM | POA: Insufficient documentation

## 2016-12-03 LAB — CBC WITH DIFFERENTIAL/PLATELET
BASOS ABS: 0.1 10*3/uL (ref 0.0–0.1)
BASOS PCT: 1 %
BASOS PCT: 1 %
Basophils Absolute: 0 10*3/uL (ref 0.0–0.1)
EOS ABS: 0.5 10*3/uL (ref 0.0–0.7)
EOS ABS: 0.6 10*3/uL (ref 0.0–0.7)
EOS PCT: 10 %
EOS PCT: 9 %
HCT: 33.7 % — ABNORMAL LOW (ref 36.0–46.0)
HCT: 36.7 % (ref 36.0–46.0)
HEMOGLOBIN: 9.9 g/dL — AB (ref 12.0–15.0)
Hemoglobin: 10.9 g/dL — ABNORMAL LOW (ref 12.0–15.0)
Lymphocytes Relative: 25 %
Lymphocytes Relative: 17 %
Lymphs Abs: 1.4 10*3/uL (ref 0.7–4.0)
Lymphs Abs: 1.2 10*3/uL (ref 0.7–4.0)
MCH: 25.1 pg — ABNORMAL LOW (ref 26.0–34.0)
MCH: 25.2 pg — ABNORMAL LOW (ref 26.0–34.0)
MCHC: 29.4 g/dL — ABNORMAL LOW (ref 30.0–36.0)
MCHC: 29.7 g/dL — AB (ref 30.0–36.0)
MCV: 85.3 fL (ref 78.0–100.0)
MCV: 85 fL (ref 78.0–100.0)
MONO ABS: 0.6 10*3/uL (ref 0.1–1.0)
MONOS PCT: 9 %
Monocytes Absolute: 0.4 10*3/uL (ref 0.1–1.0)
Monocytes Relative: 8 %
NEUTROS PCT: 56 %
NEUTROS PCT: 64 %
Neutro Abs: 3.1 10*3/uL (ref 1.7–7.7)
Neutro Abs: 4.3 10*3/uL (ref 1.7–7.7)
PLATELETS: 234 10*3/uL (ref 150–400)
PLATELETS: 252 10*3/uL (ref 150–400)
RBC: 3.95 MIL/uL (ref 3.87–5.11)
RBC: 4.32 MIL/uL (ref 3.87–5.11)
RDW: 13.4 % (ref 11.5–15.5)
RDW: 13.3 % (ref 11.5–15.5)
WBC: 5.5 10*3/uL (ref 4.0–10.5)
WBC: 6.8 10*3/uL (ref 4.0–10.5)

## 2016-12-03 LAB — COMPREHENSIVE METABOLIC PANEL
ALBUMIN: 4.6 g/dL (ref 3.5–5.0)
ALBUMIN: 4.9 g/dL (ref 3.5–5.0)
ALT: 10 U/L — AB (ref 14–54)
ALT: 10 U/L — ABNORMAL LOW (ref 14–54)
ANION GAP: 8 (ref 5–15)
AST: 16 U/L (ref 15–41)
AST: 18 U/L (ref 15–41)
Alkaline Phosphatase: 95 U/L (ref 38–126)
Alkaline Phosphatase: 100 U/L (ref 38–126)
Anion gap: 8 (ref 5–15)
BUN: 14 mg/dL (ref 6–20)
BUN: 11 mg/dL (ref 6–20)
CHLORIDE: 107 mmol/L (ref 101–111)
CO2: 27 mmol/L (ref 22–32)
CO2: 28 mmol/L (ref 22–32)
CREATININE: 0.75 mg/dL (ref 0.44–1.00)
Calcium: 10 mg/dL (ref 8.9–10.3)
Calcium: 10 mg/dL (ref 8.9–10.3)
Chloride: 102 mmol/L (ref 101–111)
Creatinine, Ser: 0.78 mg/dL (ref 0.44–1.00)
GFR calc Af Amer: >60 mL/min (ref >60)
GFR calc non Af Amer: >60 mL/min (ref >60)
GFR calc non Af Amer: >60 mL/min (ref >60)
GLUCOSE: 109 mg/dL — AB (ref 65–99)
GLUCOSE: 101 mg/dL — AB (ref 65–99)
POTASSIUM: 3.1 mmol/L — AB (ref 3.5–5.1)
Potassium: 4 mmol/L (ref 3.5–5.1)
SODIUM: 142 mmol/L (ref 135–145)
SODIUM: 138 mmol/L (ref 135–145)
Total Bilirubin: 0.7 mg/dL (ref 0.3–1.2)
Total Bilirubin: 0.7 mg/dL (ref 0.3–1.2)
Total Protein: 7.4 g/dL (ref 6.5–8.1)
Total Protein: 7.8 g/dL (ref 6.5–8.1)

## 2016-12-03 LAB — LIPASE, BLOOD: Lipase: 28 U/L (ref 11–51)

## 2016-12-03 MED ORDER — ONDANSETRON HCL 4 MG/2ML IJ SOLN
4.0000 mg | Freq: Once | INTRAMUSCULAR | Status: AC
  Administered 2016-12-03: 4 mg via INTRAVENOUS
  Filled 2016-12-03: qty 2

## 2016-12-03 MED ORDER — IOPAMIDOL (ISOVUE-300) INJECTION 61%
100.0000 mL | Freq: Once | INTRAVENOUS | Status: AC | PRN
  Administered 2016-12-03: 100 mL via INTRAVENOUS

## 2016-12-03 MED ORDER — SODIUM CHLORIDE 0.9 % IV BOLUS (SEPSIS)
1000.0000 mL | Freq: Once | INTRAVENOUS | Status: AC
Start: 2016-12-03 — End: 2016-12-03
  Administered 2016-12-03: 1000 mL via INTRAVENOUS

## 2016-12-03 MED ORDER — POTASSIUM CHLORIDE 10 MEQ/100ML IV SOLN
10.0000 meq | INTRAVENOUS | Status: AC
  Administered 2016-12-04 (×2): 10 meq via INTRAVENOUS
  Filled 2016-12-03 (×2): qty 100

## 2016-12-03 MED ORDER — IOPAMIDOL (ISOVUE-300) INJECTION 61%
INTRAVENOUS | Status: AC
  Filled 2016-12-03: qty 30

## 2016-12-03 MED ORDER — MORPHINE SULFATE (PF) 4 MG/ML IV SOLN
4.0000 mg | Freq: Once | INTRAVENOUS | Status: AC
  Administered 2016-12-03: 4 mg via INTRAVENOUS
  Filled 2016-12-03: qty 1

## 2016-12-03 NOTE — Progress Notes (Signed)
Referring Provider: Keane Police, MD Primary Care Physician:  Keane Police, MD Primary GI: Dr. Oneida Alar   Chief Complaint  Patient presents with  . Abdominal Pain    RUQ x 1 week w/ burning sensation  . Diarrhea    x 5 days, watery    HPI:   Christine Bolton is a 71 y.o. female presenting today with a history of  autoimmune hemolytic anemia, IDA, heme positive stool, s/p colonoscopy/EGD, capsule study, and enteroscopy. Next colonoscopy due in 5-10 years due to history of adenoma. EGD with mild gastritis/duodenitis, no evidence for celiac sprue. Capsule study revealed AVMs as source for anemia. 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 Presents today with new onset abdominal pain and diarrhea.    Noted the week of thanksgiving upper abdominal pain/lower abdominal pain, associated nausea but no vomiting. Yesterday had watery diarrhea. When eating something has postprandial loose stool. Diarrhea present for about 5 days. Last antibiotic exposure about a month ago. Stool is black on iron. Last week had loose stool multiple times. Today pain is improved but still present. Feels like "something isn't right".  Intermittent. Associated with diarrhea. No aggravating factors. Not taking Linzess unless needed. No NSAIDs or aspirin powders. Took Mylanta to ease a little bit. Some chills, no fever. No sick contacts but she states "everybody got sick behind me". Nephew and niece with vomiting.   Past Medical History:  Diagnosis Date  . Arthritis   . Asthma   . CHF (congestive heart failure) ([place])   . Constipation   . COPD (chronic obstructive pulmonary disease) ([place])   . Coronary artery disease   . Diabetes ([place])   . Dyspnea    exertion  . GERD (gastroesophageal reflux disease)   . Hemolytic anemia ([place])   . HTN (hypertension)   . Hypercholesterolemia   . IDA (iron deficiency anemia)   . Neuromuscular disorder  ([place])   . Sleep apnea    uses bipap    Past Surgical History:  Procedure Laterality Date  . BACK SURGERY    . BIOPSY  08/13/2016   Procedure: BIOPSY;  Surgeon: Danie Binder, MD;  [facility];  Service: Endoscopy;;  gastric duodenal  . CAROTID ENDARTERECTOMY    . COLONOSCOPY  2014   normal (?) she is not sure  . COLONOSCOPY WITH PROPOFOL N/A 08/13/2016   Dr. Oneida Alar: one 4 mm tubular adenoma at hepatic flexure, mild diverticulosis in mid ascending colon, extremely redundant left colon, external and internal hemorrhoids, surveillance in 5-10 years  . ECTOPIC PREGNANCY SURGERY    . ENTEROSCOPY N/A 10/15/2016   single angioectasia with no bleeding in second portion of duodenum, s/p APC. 2 angioectasias without bleeding in proximal jejunum, s/p APC  . ESOPHAGOGASTRODUODENOSCOPY (EGD) WITH PROPOFOL N/A 08/13/2016   Dr. Oneida Alar: widely patent Schatzki ring, mild gastritis/duodenitis, small hiatal hernia, path with chroni gastritis, negatice celiac sprue  . fibroid tumor    . GIVENS CAPSULE STUDY N/A 09/10/2016   small bowel AVMs  . HOT HEMOSTASIS  10/15/2016   Procedure: HOT HEMOSTASIS (ARGON PLASMA COAGULATION/BICAP);  Surgeon: Danie Binder, MD;  [facility];  Service: Endoscopy;;  small bowel  . lower extremity bypass    . PARTIAL HYSTERECTOMY    . POLYPECTOMY  08/13/2016   Procedure: POLYPECTOMY;  Surgeon: Danie Binder, MD;  [facility];  Service: Endoscopy;;  colon  Current Outpatient Medications  Medication Sig Dispense Refill  . Albuterol Sulfate 108 (90 Base) MCG/ACT AEPB Inhale 1 puff into the lungs 2 (two) times daily as needed (wheezing).     Marland Kitchen alendronate (FOSAMAX) 70 MG tablet Take 70 mg by mouth once a week. Every Tuesday  2  . aspirin EC 81 MG tablet Take 81 mg by mouth daily.    Marland Kitchen atorvastatin (LIPITOR) 40 MG tablet Take 1 tablet by mouth daily.    . Calcium Carbonate-Vitamin D (CALTRATE 600+D PO) Take by mouth daily.    . Calcium  Carbonate-Vitamin D (CALTRATE 600+D) 600-400 MG-UNIT tablet Take 1 tablet by mouth daily.    . carvedilol (COREG) 6.25 MG tablet Take 6.25 mg by mouth every 12 (twelve) hours.     . ferrous sulfate 325 (65 FE) MG EC tablet Take 325 mg by mouth 2 (two) times daily.    . folic acid (FOLVITE) 1 MG tablet Take 1 tablet by mouth daily.    . hydrOXYzine (VISTARIL) 25 MG capsule Take 25 mg by mouth at bedtime.    . INCRUSE ELLIPTA 62.5 MCG/INH AEPB INHALE 1 PUFF PO bid  3  . ipratropium (ATROVENT) 0.06 % nasal spray INSTILL 1 SPRAY IN BOTH NOSTRILS Q 4 HOURS PRN  2  . ipratropium-albuterol (DUONEB) 0.5-2.5 (3) MG/3ML SOLN Inhale 3 mLs into the lungs daily as needed (WHEEZING, SHORTNESS OF BREATH).   0  . LINZESS 145 MCG CAPS capsule Take 145 mcg by mouth daily as needed (IBS, constipation).   3  . losartan (COZAAR) 100 MG tablet Take 100 mg by mouth daily.    Marland Kitchen MAGNESIUM-OXIDE 400 (241.3 Mg) MG tablet TAKE 1 TABLET BY MOUTH TWICE DAILY  3  . metFORMIN (GLUCOPHAGE) 500 MG tablet Take 500 mg by mouth daily (with breakfast). Take with meal     . nitroGLYCERIN (NITROSTAT) 0.4 MG SL tablet Place 0.4 mg under the tongue every 5 (five) minutes as needed for chest pain.    Marland Kitchen olopatadine (PATANOL) 0.1 % ophthalmic solution Place 1 drop into both eyes at bedtime.    Marland Kitchen omeprazole (PRILOSEC) 20 MG capsule Take 1 capsule (20 mg total) by mouth daily. 30 minutes before breakfast 90 capsule 3  . PRESCRIPTION MEDICATION Take 1 tablet by mouth daily. Domperidone 10 mg for a.    . spironolactone (ALDACTONE) 25 MG tablet Take 1 tablet by mouth daily.  2  . tiZANidine (ZANAFLEX) 4 MG tablet Take 4 mg by mouth 2 (two) times daily.     . Topiramate ER (QUDEXY XR) 25 MG CS24 sprinkle cap Take 2 capsules by mouth at bedtime.    . traMADol (ULTRAM) 50 MG tablet Take 50 mg by mouth TWICE DAILY    . traZODone (DESYREL) 50 MG tablet Take 50 mg by mouth at bedtime.  1   No current facility-administered medications for this visit.      Allergies as of 12/03/2016 - Review Complete 12/03/2016  Allergen Reaction Noted  . Levofloxacin Other (See Comments) 04/12/2011    Family History  Problem Relation Age of Onset  . Colon cancer Neg Hx        no first degree relatives. States cousin had colon cancer  . Colon polyps Neg Hx     Social History   Socioeconomic History  . Marital status: Divorced    Spouse name: None  . Number of children: None  . Years of education: None  . Highest education level: None  Social  Needs  . Financial resource strain: None  . Food insecurity - worry: None  . Food insecurity - inability: None  . Transportation needs - medical: None  . Transportation needs - non-medical: None  Occupational History  . None  Tobacco Use  . Smoking status: Former Smoker    Types: Cigarettes    Last attempt to quit: 2002    Years since quitting: 16.9  . Smokeless tobacco: Never Used  Substance and Sexual Activity  . Alcohol use: No  . Drug use: No  . Sexual activity: None  Other Topics Concern  . None  Social History Narrative  . None    Review of Systems: Gen: see HPI  CV: Denies chest pain, palpitations, syncope, peripheral edema, and claudication. Resp: Denies dyspnea at rest, cough, wheezing, coughing up blood, and pleurisy. GI: see HPI  Derm: Denies rash, itching, dry skin Psych: Denies depression, anxiety, memory loss, confusion. No homicidal or suicidal ideation.  Heme: Denies bruising, bleeding, and enlarged lymph nodes.  Physical Exam: BP (!) 156/71   Pulse 67   Temp 98.4 F (36.9 C) (Oral)   Ht 5\' 3"  (1.6 m)   Wt 189 lb 12.8 oz (86.1 kg)   BMI 33.62 kg/m  General:   Alert and oriented. No distress noted. Pleasant and cooperative.  Head:  Normocephalic and atraumatic. Eyes:  Conjuctiva clear without scleral icterus. Mouth:  Oral mucosa pink and moist.  Abdomen:  +BS, soft, TTP diffusely but without rebound or guarding and non-distended.  No HSM or masses noted. Msk:   Symmetrical without gross deformities. Normal posture. Extremities:  Without edema. Neurologic:  Alert and  oriented x4 Psych:  Alert and cooperative. Normal mood and affect.

## 2016-12-03 NOTE — ED Triage Notes (Signed)
Pt with abd pain, had abd CT done today and sent by PCP for bowel obstruction

## 2016-12-03 NOTE — Patient Instructions (Signed)
We have arranged a CT scan today. I have also ordered stool studies, as we need to see if you have an infection going on.   Please have blood work done today as well.  Further recommendations to follow!

## 2016-12-03 NOTE — Progress Notes (Signed)
cc'ed to pcp °

## 2016-12-03 NOTE — Assessment & Plan Note (Signed)
Presenting with new onset diarrhea, historically constipated. Linzess on hold and only taken prn. Last exposure to antibiotics a month ago. Associated abdominal discomfort with diarrhea. Need to rule out infectious process. Cdiff, giardia, culture ordered. Continue to hold Linzess. Due to abdominal pain noted as diffuse, CT ordered. +Chills but no fever. Does not appear toxic at time of appointment today. CBC, CMP ordered as well.

## 2016-12-03 NOTE — Progress Notes (Signed)
Late entry: CT results reviewed. SBO with transition zone appreciated on CT. Contacted patient at home. She notes persistent vague abdominal burning, +nausea but no vomiting today. She has not had any oral intake other than liquids. Last intake yesterday morning of toast. She notes persistent diarrhea with any oral intake. Minimal flatus.  In clinic, she was moderately tender to palpation. Recent CBC reviewed without leukocytosis. CMP on file from today. Clinically is not consistent with a complete obstruction and moreso a partial obstruction. She has had a thorough GI evaluation thus far to include EGD, colonoscopy, capsule study, and enteroscopy most recently in October due to AVMs. She would benefit from observation, as she has had persistent pain without improvement and inability to tolerate diet. Hopefully, she will improve with supportive measures. Doubt dealing with tumor as small bowel was recently imaged via capsule and enteroscopy. Recommended to patient to report to ED. Spoke with charge nurse Magda Paganini and left my information in case further information needed.

## 2016-12-03 NOTE — Assessment & Plan Note (Signed)
History of autoimmune hemolytic anemia, IDA, followed closely by Hematology (Dr. Junius Roads) at [facility]. Last [place] in October. Recheck CBC now. 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 Stool black on iron but no other obvious overt GI bleeding. Continue to follow CBC serially, keep appts with Hematology, avoid NSAIDs, aspirin powders. PPI daily.

## 2016-12-04 ENCOUNTER — Observation Stay (HOSPITAL_COMMUNITY): Payer: Medicare HMO

## 2016-12-04 ENCOUNTER — Other Ambulatory Visit: Payer: Self-pay

## 2016-12-04 ENCOUNTER — Encounter (HOSPITAL_COMMUNITY): Payer: Self-pay | Admitting: Internal Medicine

## 2016-12-04 DIAGNOSIS — G4730 Sleep apnea, unspecified: Secondary | ICD-10-CM | POA: Diagnosis present

## 2016-12-04 DIAGNOSIS — E118 Type 2 diabetes mellitus with unspecified complications: Secondary | ICD-10-CM | POA: Diagnosis not present

## 2016-12-04 DIAGNOSIS — I509 Heart failure, unspecified: Secondary | ICD-10-CM | POA: Diagnosis present

## 2016-12-04 DIAGNOSIS — K219 Gastro-esophageal reflux disease without esophagitis: Secondary | ICD-10-CM | POA: Diagnosis present

## 2016-12-04 DIAGNOSIS — I2510 Atherosclerotic heart disease of native coronary artery without angina pectoris: Secondary | ICD-10-CM | POA: Diagnosis not present

## 2016-12-04 DIAGNOSIS — Z79899 Other long term (current) drug therapy: Secondary | ICD-10-CM | POA: Diagnosis not present

## 2016-12-04 DIAGNOSIS — Z7982 Long term (current) use of aspirin: Secondary | ICD-10-CM | POA: Diagnosis not present

## 2016-12-04 DIAGNOSIS — D509 Iron deficiency anemia, unspecified: Secondary | ICD-10-CM | POA: Diagnosis not present

## 2016-12-04 DIAGNOSIS — Z87891 Personal history of nicotine dependence: Secondary | ICD-10-CM | POA: Diagnosis not present

## 2016-12-04 DIAGNOSIS — K56609 Unspecified intestinal obstruction, unspecified as to partial versus complete obstruction: Secondary | ICD-10-CM | POA: Diagnosis not present

## 2016-12-04 DIAGNOSIS — K5900 Constipation, unspecified: Secondary | ICD-10-CM | POA: Diagnosis present

## 2016-12-04 DIAGNOSIS — D589 Hereditary hemolytic anemia, unspecified: Secondary | ICD-10-CM | POA: Diagnosis present

## 2016-12-04 DIAGNOSIS — Z7984 Long term (current) use of oral hypoglycemic drugs: Secondary | ICD-10-CM | POA: Diagnosis not present

## 2016-12-04 DIAGNOSIS — J449 Chronic obstructive pulmonary disease, unspecified: Secondary | ICD-10-CM | POA: Diagnosis present

## 2016-12-04 DIAGNOSIS — K56600 Partial intestinal obstruction, unspecified as to cause: Secondary | ICD-10-CM | POA: Diagnosis present

## 2016-12-04 DIAGNOSIS — K5520 Angiodysplasia of colon without hemorrhage: Secondary | ICD-10-CM | POA: Diagnosis present

## 2016-12-04 DIAGNOSIS — I10 Essential (primary) hypertension: Secondary | ICD-10-CM | POA: Diagnosis not present

## 2016-12-04 DIAGNOSIS — E876 Hypokalemia: Secondary | ICD-10-CM | POA: Diagnosis present

## 2016-12-04 DIAGNOSIS — E7800 Pure hypercholesterolemia, unspecified: Secondary | ICD-10-CM | POA: Diagnosis not present

## 2016-12-04 DIAGNOSIS — I110 Hypertensive heart disease with heart failure: Secondary | ICD-10-CM | POA: Diagnosis present

## 2016-12-04 DIAGNOSIS — E119 Type 2 diabetes mellitus without complications: Secondary | ICD-10-CM

## 2016-12-04 DIAGNOSIS — Z9989 Dependence on other enabling machines and devices: Secondary | ICD-10-CM | POA: Diagnosis not present

## 2016-12-04 DIAGNOSIS — Z881 Allergy status to other antibiotic agents status: Secondary | ICD-10-CM | POA: Diagnosis not present

## 2016-12-04 DIAGNOSIS — Z7983 Long term (current) use of bisphosphonates: Secondary | ICD-10-CM | POA: Diagnosis not present

## 2016-12-04 DIAGNOSIS — E785 Hyperlipidemia, unspecified: Secondary | ICD-10-CM | POA: Diagnosis present

## 2016-12-04 DIAGNOSIS — Z7951 Long term (current) use of inhaled steroids: Secondary | ICD-10-CM | POA: Diagnosis not present

## 2016-12-04 DIAGNOSIS — M1990 Unspecified osteoarthritis, unspecified site: Secondary | ICD-10-CM | POA: Diagnosis present

## 2016-12-04 DIAGNOSIS — K31819 Angiodysplasia of stomach and duodenum without bleeding: Secondary | ICD-10-CM | POA: Diagnosis present

## 2016-12-04 LAB — CBC WITH DIFFERENTIAL/PLATELET
Basophils Absolute: 0 10*3/uL (ref 0.0–0.1)
Basophils Relative: 0 %
EOS PCT: 8 %
Eosinophils Absolute: 0.5 10*3/uL (ref 0.0–0.7)
HEMATOCRIT: 34.2 % — AB (ref 36.0–46.0)
Hemoglobin: 10.1 g/dL — ABNORMAL LOW (ref 12.0–15.0)
LYMPHS ABS: 1.2 10*3/uL (ref 0.7–4.0)
LYMPHS PCT: 19 %
MCH: 25.3 pg — AB (ref 26.0–34.0)
MCHC: 29.5 g/dL — AB (ref 30.0–36.0)
MCV: 85.7 fL (ref 78.0–100.0)
MONO ABS: 0.7 10*3/uL (ref 0.1–1.0)
MONOS PCT: 11 %
NEUTROS ABS: 3.9 10*3/uL (ref 1.7–7.7)
Neutrophils Relative %: 62 %
PLATELETS: 213 10*3/uL (ref 150–400)
RBC: 3.99 MIL/uL (ref 3.87–5.11)
RDW: 13.3 % (ref 11.5–15.5)
WBC: 6.3 10*3/uL (ref 4.0–10.5)

## 2016-12-04 LAB — COMPREHENSIVE METABOLIC PANEL
ALT: 10 U/L — ABNORMAL LOW (ref 14–54)
AST: 15 U/L (ref 15–41)
Albumin: 4.2 g/dL (ref 3.5–5.0)
Alkaline Phosphatase: 95 U/L (ref 38–126)
Anion gap: 7 (ref 5–15)
BILIRUBIN TOTAL: 0.6 mg/dL (ref 0.3–1.2)
BUN: 8 mg/dL (ref 6–20)
CHLORIDE: 109 mmol/L (ref 101–111)
CO2: 25 mmol/L (ref 22–32)
Calcium: 9.2 mg/dL (ref 8.9–10.3)
Creatinine, Ser: 0.8 mg/dL (ref 0.44–1.00)
Glucose, Bld: 113 mg/dL — ABNORMAL HIGH (ref 65–99)
POTASSIUM: 4 mmol/L (ref 3.5–5.1)
Sodium: 141 mmol/L (ref 135–145)
TOTAL PROTEIN: 6.7 g/dL (ref 6.5–8.1)

## 2016-12-04 LAB — GLUCOSE, CAPILLARY
GLUCOSE-CAPILLARY: 93 mg/dL (ref 65–99)
GLUCOSE-CAPILLARY: 78 mg/dL (ref 65–99)
Glucose-Capillary: 106 mg/dL — ABNORMAL HIGH (ref 65–99)
Glucose-Capillary: 121 mg/dL — ABNORMAL HIGH (ref 65–99)
Glucose-Capillary: 100 mg/dL — ABNORMAL HIGH (ref 65–99)

## 2016-12-04 LAB — MAGNESIUM: Magnesium: 1.8 mg/dL (ref 1.7–2.4)

## 2016-12-04 MED ORDER — OLOPATADINE HCL 0.1 % OP SOLN
1.0000 [drp] | Freq: Every day | OPHTHALMIC | Status: DC
  Administered 2016-12-05 – 2016-12-06 (×2): 1 [drp] via OPHTHALMIC
  Filled 2016-12-04: qty 5

## 2016-12-04 MED ORDER — PANTOPRAZOLE SODIUM 40 MG IV SOLR
40.0000 mg | INTRAVENOUS | Status: DC
  Administered 2016-12-04 – 2016-12-07 (×4): 40 mg via INTRAVENOUS
  Filled 2016-12-04 (×4): qty 40

## 2016-12-04 MED ORDER — HYDROXYZINE PAMOATE 25 MG PO CAPS
25.0000 mg | ORAL_CAPSULE | Freq: Every day | ORAL | Status: DC
  Filled 2016-12-04 (×5): qty 1

## 2016-12-04 MED ORDER — MORPHINE SULFATE (PF) 4 MG/ML IV SOLN
4.0000 mg | INTRAVENOUS | Status: DC | PRN
  Administered 2016-12-04: 4 mg via INTRAVENOUS
  Filled 2016-12-04: qty 1

## 2016-12-04 MED ORDER — TIZANIDINE HCL 4 MG PO TABS
8.0000 mg | ORAL_TABLET | Freq: Every day | ORAL | Status: DC
  Administered 2016-12-05 – 2016-12-06 (×2): 8 mg via ORAL
  Filled 2016-12-04 (×2): qty 2

## 2016-12-04 MED ORDER — HYDRALAZINE HCL 20 MG/ML IJ SOLN
10.0000 mg | INTRAMUSCULAR | Status: DC | PRN
  Administered 2016-12-04 – 2016-12-05 (×3): 10 mg via INTRAVENOUS
  Filled 2016-12-04 (×3): qty 1

## 2016-12-04 MED ORDER — ACETAMINOPHEN 650 MG RE SUPP
650.0000 mg | Freq: Four times a day (QID) | RECTAL | Status: DC | PRN
  Administered 2016-12-04: 650 mg via RECTAL
  Filled 2016-12-04: qty 1

## 2016-12-04 MED ORDER — IPRATROPIUM-ALBUTEROL 0.5-2.5 (3) MG/3ML IN SOLN: 3.0000 mL | Freq: Four times a day (QID) | RESPIRATORY_TRACT | Status: DC | PRN

## 2016-12-04 MED ORDER — IPRATROPIUM BROMIDE 0.06 % NA SOLN
1.0000 | NASAL | Status: DC | PRN
  Filled 2016-12-04: qty 15

## 2016-12-04 MED ORDER — MAGNESIUM SULFATE 2 GM/50ML IV SOLN
2.0000 g | Freq: Once | INTRAVENOUS | Status: AC
  Administered 2016-12-04: 2 g via INTRAVENOUS
  Filled 2016-12-04: qty 50

## 2016-12-04 MED ORDER — ONDANSETRON HCL 4 MG/2ML IJ SOLN: 4.0000 mg | Freq: Four times a day (QID) | INTRAMUSCULAR | Status: DC | PRN

## 2016-12-04 MED ORDER — ONDANSETRON HCL 4 MG PO TABS: 4.0000 mg | ORAL_TABLET | Freq: Four times a day (QID) | ORAL | Status: DC | PRN

## 2016-12-04 MED ORDER — KCL IN DEXTROSE-NACL 20-5-0.9 MEQ/L-%-% IV SOLN
INTRAVENOUS | Status: DC
  Administered 2016-12-04 – 2016-12-06 (×5): via INTRAVENOUS
  Filled 2016-12-04 (×3): qty 1000

## 2016-12-04 MED ORDER — FAMOTIDINE IN NACL 20-0.9 MG/50ML-% IV SOLN
20.0000 mg | Freq: Once | INTRAVENOUS | Status: AC
  Administered 2016-12-04: 20 mg via INTRAVENOUS
  Filled 2016-12-04: qty 50

## 2016-12-04 MED ORDER — TOPIRAMATE ER 25 MG PO SPRINKLE CAP24: 2.0000 | EXTENDED_RELEASE_CAPSULE | Freq: Every day | ORAL | Status: DC

## 2016-12-04 MED ORDER — INSULIN ASPART 100 UNIT/ML ~~LOC~~ SOLN
0.0000 [IU] | SUBCUTANEOUS | Status: DC
  Administered 2016-12-04 – 2016-12-06 (×3): 1 [IU] via SUBCUTANEOUS

## 2016-12-04 MED ORDER — TOPIRAMATE 25 MG PO TABS
50.0000 mg | ORAL_TABLET | Freq: Every day | ORAL | Status: DC
  Administered 2016-12-05 – 2016-12-06 (×2): 50 mg via ORAL
  Filled 2016-12-04 (×2): qty 2

## 2016-12-04 MED ORDER — MORPHINE SULFATE (PF) 2 MG/ML IV SOLN
2.0000 mg | INTRAVENOUS | Status: DC | PRN
  Administered 2016-12-04 – 2016-12-05 (×2): 2 mg via INTRAVENOUS
  Filled 2016-12-04 (×2): qty 1

## 2016-12-04 MED ORDER — INFLUENZA VAC SPLIT HIGH-DOSE 0.5 ML IM SUSY
0.5000 mL | PREFILLED_SYRINGE | INTRAMUSCULAR | Status: DC
  Filled 2016-12-04: qty 0.5

## 2016-12-04 MED ORDER — TRAZODONE HCL 50 MG PO TABS
50.0000 mg | ORAL_TABLET | Freq: Every day | ORAL | Status: DC
  Administered 2016-12-05 – 2016-12-06 (×2): 50 mg via ORAL
  Filled 2016-12-04 (×2): qty 1

## 2016-12-04 MED ORDER — NITROGLYCERIN 0.4 MG SL SUBL: 0.4000 mg | SUBLINGUAL_TABLET | SUBLINGUAL | Status: DC | PRN

## 2016-12-04 NOTE — Progress Notes (Signed)
PROGRESS NOTE                                                                                                                                                                                                             Patient Demographics:    Christine Bolton, is a 71 y.o. female, DOB - 09/05/1945, YPP:[id number]  Admit date - 12/03/2016   Admitting Physician Reubin Milan, MD  Outpatient Primary MD for the patient is Keane Police, MD  LOS - 0  Outpatient Specialists: GI (Dr. Oneida Alar)  Chief Complaint  Patient presents with  . Abdominal Pain       Brief Narrative   71 year old female with history of iron deficiency anemia autoimmune hemolytic anemia with recent extensive workup (EGD/colonoscopy/capsule study and enteroscopy), COPD, type 2 diabetes mellitus, GERD, asthma, history of CHF, coronary artery disease, sleep apnea on BiPAP at home and history of laparotomy for back surgery sent to the ED from GI clinic after she was found to have partial small bowel obstruction on CT of the abdomen. Patient reported 2 weeks history of diffuse abdominal discomfort and indigestion. One week back she noticed that she was unable to hold anything she ate and started having diarrhea followed by loose stools. Denied any fever but had fatigue, burning epigastric pain and chills.  In the ED vitals were stable except for elevated blood pressure. Blood work showed hemoglobin of 10.9, potassium of 3.1 with normal lipase and LFTs. Admitted for further management.   Subjective:   Patient reports her abdominal pain to have improved this morning. Denies any nausea, vomiting or diarrhea. No fevers or chills.   Assessment  & Plan :    Principal Problem:   SBO (small bowel obstruction) ([place]) As seen on CT abdomen from 11/27. Follow-up abdominal x-ray shows persistent distention of small and large bowel with contrast traversing into the  colon suggestive of partial small bowel obstruction. Keep nothing by mouth. No active abdominal distention, nausea or vomiting. Supportive care with IV fluids, pain medications and antiemetics. Serial abdominal exam and x-ray. Keep k >4. Surgery consulted.   Active Problems: Iron deficiency/hemolytic anemia H&H stable. Had extensive workup one month back with capsule study showing AVMs likely source of anemia.  Diarrhea  Currently resolved. Monitor for now.  Hypokalemia Replenish with IV fluids. Keep K >4. Magnesium.  GERD (gastroesophageal reflux disease) Continue PPI    COPD (chronic obstructive pulmonary disease) (HCC) Stable. Continue when necessary nebs and inhaler.  Coronary artery disease On aspirin, statin, Coreg and lisinopril (held since patient is nothing by mouth)  Diabetes mellitus type II Hold metformin. Monitor on sliding scale coverage.  Essential hypertension stable. Home blood pressure medication held. Monitor on when necessary hydralazine per    Code Status : Full code  Family Communication  : None at bedside  Disposition Plan  : Home once improved  Barriers For Discharge : Active symptoms  Consults  :  Surgery  Procedures  : CT abdomen and pelvis  DVT Prophylaxis  :  Lovenox -   Lab Results  Component Value Date   PLT 213 12/04/2016    Antibiotics  :    Anti-infectives (From admission, onward)   None        Objective:   Vitals:   12/04/16 0100 12/04/16 0233 12/04/16 0242 12/04/16 0400  BP: (!) 163/66 (!) 166/53  (!) 157/56  Pulse: 61 62  61  Resp: 18 18  18   Temp:  98.5 F (36.9 C)  98.3 F (36.8 C)  TempSrc:  Oral  Oral  SpO2: 96% 100% 100% 100%  Weight:  83.9 kg (185 lb)    Height:  5\' 3"  (1.6 m)      Wt Readings from Last 3 Encounters:  12/04/16 83.9 kg (185 lb)  12/03/16 86.1 kg (189 lb 12.8 oz)  10/15/16 85.7 kg (189 lb)     Intake/Output Summary (Last 24 hours) at 12/04/2016 1126 Last data filed at  12/04/2016 0600 Gross per 24 hour  Intake 591.67 ml  Output 200 ml  Net 391.67 ml     Physical Exam  Gen: not in distress HEENT: Pallor present, dry oral mucosa, supple neck Chest: clear b/l, no added sounds CVS: N S1&S2, no murmurs, rubs or gallop GI: soft, NT, ND, sluggish bowel sounds Musculoskeletal: warm, no edema     Data Review:    CBC Recent Labs  Lab 12/03/16 1348 12/03/16 2240 12/04/16 0559  WBC 5.5 6.8 6.3  HGB 9.9* 10.9* 10.1*  HCT 33.7* 36.7 34.2*  PLT 234 252 213  MCV 85.3 85.0 85.7  MCH 25.1* 25.2* 25.3*  MCHC 29.4* 29.7* 29.5*  RDW 13.4 13.3 13.3  LYMPHSABS 1.4 1.2 1.2  MONOABS 0.4 0.6 0.7  EOSABS 0.5 0.6 0.5  BASOSABS 0.1 0.0 0.0    Chemistries  Recent Labs  Lab 12/03/16 1348 12/03/16 2240 12/04/16 0559  NA 142 138 141  K 4.0 3.1* 4.0  CL 107 102 109  CO2 27 28 25   GLUCOSE 109* 101* 113*  BUN 14 11 8   CREATININE 0.75 0.78 0.80  CALCIUM 10.0 10.0 9.2  MG  --  1.8  --   AST 16 18 15   ALT 10* 10* 10*  ALKPHOS 95 100 95  BILITOT 0.7 0.7 0.6   ------------------------------------------------------------------------------------------------------------------ No results for input(s): CHOL, HDL, LDLCALC, TRIG, CHOLHDL, LDLDIRECT in the last 72 hours.  No results found for: HGBA1C ------------------------------------------------------------------------------------------------------------------ No results for input(s): TSH, T4TOTAL, T3FREE, THYROIDAB in the last 72 hours.  Invalid input(s): FREET3 ------------------------------------------------------------------------------------------------------------------ No results for input(s): VITAMINB12, FOLATE, FERRITIN, TIBC, IRON, RETICCTPCT in the last 72 hours.  Coagulation profile No results for input(s): INR, PROTIME in the last 168 hours.  No results for input(s): DDIMER in the last 72 hours.  Cardiac Enzymes No results for input(s): CKMB, TROPONINI, MYOGLOBIN in the last  168  hours.  Invalid input(s): CK ------------------------------------------------------------------------------------------------------------------ No results found for: BNP  Inpatient Medications  Scheduled Meds: . hydrOXYzine  25 mg Oral QHS  . [START ON 12/05/2016] Influenza vac split quadrivalent PF  0.5 mL Intramuscular Tomorrow-1000  . olopatadine  1 drop Both Eyes QHS  . pantoprazole (PROTONIX) IV  40 mg Intravenous Q24H  . tiZANidine  8 mg Oral QHS  . topiramate  50 mg Oral QHS  . traZODone  50 mg Oral QHS   Continuous Infusions: . dextrose 5 % and 0.9 % NaCl with KCl 20 mEq/L 100 mL/hr at 12/04/16 0126   PRN Meds:.hydrALAZINE, ipratropium, ipratropium-albuterol, morphine injection, nitroGLYCERIN, ondansetron **OR** ondansetron (ZOFRAN) IV  Micro Results No results found for this or any previous visit (from the past 240 hour(s)).  Radiology Reports Ct Abdomen Pelvis W Contrast  Result Date: 12/03/2016 CLINICAL DATA:  Generalized abdominal pain with burning for 2 weeks, history of diabetes mellitus, CHF, COPD, hypertension EXAM: CT ABDOMEN AND PELVIS WITH CONTRAST TECHNIQUE: Multidetector CT imaging of the abdomen and pelvis was performed using the standard protocol following bolus administration of intravenous contrast. Sagittal and coronal MPR images reconstructed from axial data set. CONTRAST:  18mL ISOVUE-300 IOPAMIDOL (ISOVUE-300) INJECTION 61% IV. Dilute oral contrast. COMPARISON:  None FINDINGS: Lower chest: Lung bases clear Hepatobiliary: Gallbladder and liver normal appearance Pancreas: Normal appearance Spleen: Normal appearance Adrenals/Urinary Tract: BILATERAL renal cysts largest upper pole LEFT kidney 3.6 x 3.3 cm image 20. Scattered renal vascular calcifications bilaterally. No hydronephrosis or hydroureter. Bladder decompressed. Stomach/Bowel: Low-lying cecum in pelvis. Normal appendix anterior mid sacrum. Few dilated small bowel loops in the LEFT mid abdomen with  decompressed distal small bowel loops consistent with small bowel obstruction. Transition from dilated to nondilated small bowel occurs in the upper LEFT pelvis best appreciated coronal image 58 where mild wall thickening is seen. This is nonspecific, could be due to inflammatory process, tumor or scarring. Colon decompressed and unremarkable. No definite gastric abnormalities the proximal stomach is collapsed and poorly assessed. Vascular/Lymphatic: Atherosclerotic calcifications aorta and iliac arteries without aneurysm. Few coronary artery calcifications incidentally noted. No definite adenopathy. Reproductive: Uterus surgically absent. Normal sized ovaries bilaterally. Other: Small amount of free pelvic fluid. Small umbilical hernia containing fat. No definite free air. Musculoskeletal: Prior lumbar fusion L4-S1. IMPRESSION: Mid small bowel obstruction with transition zone in the upper LEFT pelvis where wall bowel wall thickening is identified, question due to inflammatory process, scarring, or tumor. BILATERAL renal cysts. Small umbilical hernia containing fat. Scattered atherosclerotic calcifications as above. Aortic Atherosclerosis (ICD10-I70.0). Electronically Signed   By: Lavonia Dana M.D.   On: 12/03/2016 17:27   Dg Abd Portable 2v  Result Date: 12/04/2016 CLINICAL DATA:  Small bowel loops EXAM: PORTABLE ABDOMEN - 2 VIEW COMPARISON:  12/03/2016 FINDINGS: There is no free intraperitoneal gas on the upright view. Contrast has traversed into the colon. Distended loops of small and large bowel persist. Lumbar stabilization hardware remains in place. IMPRESSION: There is continued distention of both small and large bowel, however contrast has traversed into the colon excluding complete obstruction of small bowel. Findings may reflect ileus or partial small bowel obstruction. Electronically Signed   By: Marybelle Killings M.D.   On: 12/04/2016 10:37    Time Spent in minutes  20   Elvyn Krohn M.D on  12/04/2016 at 11:26 AM  Between 7am to 7pm - Pager - [phone]  After 7pm go to [email or web address] - password [email or web address]  [facility] -  Office  [phone]     

## 2016-12-04 NOTE — H&P (Signed)
4        History and Physical    Christine Bolton IZT:[id number] DOB: Jun 03, 1945 DOA: 12/03/2016  PCP: Keane Police, MD   Patient coming from: Home.  I have personally briefly reviewed patient's old medical records in [place]  Chief Complaint: Abdominal pain and diarrhea.  HPI: Christine Bolton is a 71 y.o. female with medical history significant of arthritis, asthma, CHF, constipation, COPD, CAD, type 2 diabetes mellitus, GERD, iron deficiency anemia, hypertension, hyperlipidemia, sleep apnea on BiPAP who is coming to the emergency department referred by gastroenterology after the patient was found to have a bowel obstruction on CT scan abdomen/pelvis.  Per patient, about 2 weeks ago she started having some difficulty with digestion and gastric discomfort.  Then 2 days before Thanksgiving, she noticed that anything that she was eating would give her diarrhea.  Since Then, She kept having loose stools several times daily until Sunday.  She denies fever, but complains of chills, fatigue and burning epigastric pain.  She denies nausea, emesis, melena or hematochezia.  No dysuria or frequency.  No polyuria no polydipsia or blurred vision.  ED Course: Initial vital signs in the emergency department temperature 37.3C, pulse 77, respirations 18, blood pressure 184/65 mmHg and O2 sat 97% on room air.  She received a 1 L of NS bolus, Zofran 4 mg IVP, morphine 4 mg IVP and potassium supplementation in the emergency department.  Her WBC was 6.8 with a normal differential, hemoglobin 10.9 g/dL and platelets 252.  CMP showed a potassium of 3.1 mmol/L and a glucose of 101 mg/dL.  All other chemistry values were normal.  Lipase and magnesium were normal.   EGD in August of this year show gastritis and duodenitis. Colonoscopy done in August showed diverticulosis and a polyp was excised at the hepatic flexure.  Review of Systems: As per HPI otherwise 10 point review of systems negative.    Past  Medical History:  Diagnosis Date  . Arthritis   . Asthma   . CHF (congestive heart failure) ([place])   . Constipation   . COPD (chronic obstructive pulmonary disease) ([place])   . Coronary artery disease   . Diabetes ([place])   . Dyspnea    exertion  . GERD (gastroesophageal reflux disease)   . Hemolytic anemia ([place])   . HTN (hypertension)   . Hypercholesterolemia   . IDA (iron deficiency anemia)   . Neuromuscular disorder ([place])   . Sleep apnea    uses bipap    Past Surgical History:  Procedure Laterality Date  . BACK SURGERY    . BIOPSY  08/13/2016   Procedure: BIOPSY;  Surgeon: Danie Binder, MD;  Location: [facility];  Service: Endoscopy;;  gastric duodenal  . CAROTID ENDARTERECTOMY    . COLONOSCOPY  2014   normal (?) she is not sure  . COLONOSCOPY WITH PROPOFOL N/A 08/13/2016   Dr. Oneida Alar: one 4 mm tubular adenoma at hepatic flexure, mild diverticulosis in mid ascending colon, extremely redundant left colon, external and internal hemorrhoids, surveillance in 5-10 years  . ECTOPIC PREGNANCY SURGERY    . ENTEROSCOPY N/A 10/15/2016   single angioectasia with no bleeding in second portion of duodenum, s/p APC. 2 angioectasias without bleeding in proximal jejunum, s/p APC  . ESOPHAGOGASTRODUODENOSCOPY (EGD) WITH PROPOFOL N/A 08/13/2016   Dr. Oneida Alar: widely patent Schatzki ring, mild gastritis/duodenitis, small hiatal hernia, path with chroni gastritis, negatice celiac sprue  . fibroid tumor    . GIVENS CAPSULE STUDY  N/A 09/10/2016   small bowel AVMs  . HOT HEMOSTASIS  10/15/2016   Procedure: HOT HEMOSTASIS (ARGON PLASMA COAGULATION/BICAP);  Surgeon: Danie Binder, MD;  Location: [facility];  Service: Endoscopy;;  small bowel  . lower extremity bypass    . PARTIAL HYSTERECTOMY    . POLYPECTOMY  08/13/2016   Procedure: POLYPECTOMY;  Surgeon: Danie Binder, MD;  Location: [facility];  Service: Endoscopy;;  colon      reports that she quit smoking about 16 years ago. Her smoking  use included cigarettes. she has never used smokeless tobacco. She reports that she does not drink alcohol or use drugs.  Allergies  Allergen Reactions  . Levofloxacin Other (See Comments)    Increase heart rate    Family History  Problem Relation Age of Onset  . Diabetes Mellitus II Mother   . Heart attack Father   . Heart disease Sister   . Heart attack Brother   . Colon cancer Neg Hx        no first degree relatives. States cousin had colon cancer  . Colon polyps Neg Hx     Prior to Admission medications   Medication Sig Start Date End Date Taking? Authorizing Provider  Albuterol Sulfate 108 (90 Base) MCG/ACT AEPB Inhale 1 puff into the lungs 2 (two) times daily as needed (wheezing).    Yes [provider]  alendronate (FOSAMAX) 70 MG tablet Take 70 mg by mouth once a week. Every Tuesday 05/11/16  Yes [provider]  aspirin EC 81 MG tablet Take 81 mg by mouth daily.   Yes [provider]  atorvastatin (LIPITOR) 40 MG tablet Take 1 tablet by mouth daily. 04/17/11  Yes [provider]  Calcium Carbonate-Vitamin D (CALTRATE 600+D PO) Take 1 tablet by mouth daily.    Yes [provider]  carvedilol (COREG) 25 MG tablet Take 6.25 mg by mouth 2 (two) times daily with a meal.    Yes [provider]  ferrous sulfate 325 (65 FE) MG EC tablet Take 325 mg by mouth 2 (two) times daily.   Yes [provider]  folic acid (FOLVITE) 1 MG tablet Take 1 tablet by mouth daily. 04/16/11  Yes [provider]  hydrOXYzine (VISTARIL) 25 MG capsule Take 25 mg by mouth at bedtime.   Yes [provider]  INCRUSE ELLIPTA 62.5 MCG/INH AEPB INHALE ONE PUFF BY MOUTH TWICE DAILY 05/01/16  Yes [provider]  ipratropium (ATROVENT) 0.06 % nasal spray INSTILL 1 SPRAY IN BOTH NOSTRILS EVERY 4 HOURS AS NEEDED 06/14/16  Yes [provider]  ipratropium-albuterol (DUONEB) 0.5-2.5 (3) MG/3ML SOLN Inhale 3 mLs into the lungs at  bedtime. *MAY USE DAILY AS NEEDED FOR SHORTNESS OF BREATH/WHEEZING 10/02/16  Yes [provider]  LINZESS 145 MCG CAPS capsule Take 145 mcg by mouth daily as needed (IBS, constipation).  08/07/16  Yes [provider]  losartan (COZAAR) 100 MG tablet Take 50 mg by mouth daily.    Yes [provider]  MAGNESIUM-OXIDE 400 (241.3 Mg) MG tablet TAKE 1 TABLET BY MOUTH TWICE DAILY 06/27/16  Yes [provider]  metFORMIN (GLUCOPHAGE) 500 MG tablet Take 500 mg by mouth daily (with breakfast). Take with meal    Yes [provider]  nitroGLYCERIN (NITROSTAT) 0.4 MG SL tablet Place 0.4 mg under the tongue every 5 (five) minutes as needed for chest pain.   Yes [provider]  olopatadine (PATANOL) 0.1 % ophthalmic  solution Place 1 drop into both eyes at bedtime.   Yes [provider]  omeprazole (PRILOSEC) 20 MG capsule Take 1 capsule (20 mg total) by mouth daily. 30 minutes before breakfast 07/17/16  Yes Annitta Needs, NP  spironolactone (ALDACTONE) 25 MG tablet Take 1 tablet by mouth daily. 04/22/16  Yes [provider]  tiZANidine (ZANAFLEX) 4 MG tablet Take 8 mg by mouth at bedtime.    Yes [provider]  Topiramate ER (QUDEXY XR) 25 MG CS24 sprinkle cap Take 2 capsules by mouth at bedtime.   Yes [provider]  traMADol (ULTRAM) 50 MG tablet Take 50 mg by mouth TWICE DAILY   Yes [provider]  traZODone (DESYREL) 50 MG tablet Take 50 mg by mouth at bedtime. 08/23/16  Yes [provider]    Physical Exam: Vitals:   12/04/16 0030 12/04/16 0100 12/04/16 0233 12/04/16 0242  BP: (!) 161/69 (!) 163/66 (!) 166/53   Pulse: 67 61 62   Resp: 17 18 18    Temp:   98.5 F (36.9 C)   TempSrc:   Oral   SpO2: 100% 96% 100% 100%  Weight:   83.9 kg (185 lb)   Height:   5\' 3"  (1.6 m)     Constitutional: NAD, calm, comfortable Eyes: PERRL, lids and conjunctivae normal ENMT: Mucous membranes are mildly dry.  Posterior pharynx clear of any exudate or lesions. Neck: normal, supple, no masses, no thyromegaly Respiratory: clear to auscultation bilaterally, no wheezing, no crackles. Normal respiratory effort. No accessory muscle use.  Cardiovascular: Regular rate and rhythm, no murmurs / rubs / gallops. No extremity edema. 2+ pedal pulses. No carotid bruits.  Abdomen: Soft, positive diffuse tenderness, which is more intense in epigastric area, no guarding/rebound/masses palpated. No hepatosplenomegaly. Bowel sounds positive.  Musculoskeletal: no clubbing / cyanosis. Good ROM, no contractures. Normal muscle tone.  Skin: no significant rashes or lesions on limited skin exam.  Neurologic: CN 2-12 grossly intact. Sensation intact, DTR normal. Strength 5/5 in all 4.  Psychiatric: Normal judgment and insight. Alert and oriented x 3. Normal mood.    Labs on Admission: I have personally reviewed following labs and imaging studies  CBC: Recent Labs  Lab 12/03/16 1348 12/03/16 2240  WBC 5.5 6.8  NEUTROABS 3.1 4.3  HGB 9.9* 10.9*  HCT 33.7* 36.7  MCV 85.3 85.0  PLT 234 427   Basic Metabolic Panel: Recent Labs  Lab 12/03/16 1348 12/03/16 2240  NA 142 138  K 4.0 3.1*  CL 107 102  CO2 27 28  GLUCOSE 109* 101*  BUN 14 11  CREATININE 0.75 0.78  CALCIUM 10.0 10.0  MG  --  1.8   GFR: Estimated Creatinine Clearance: 67.1 mL/min (by C-G formula based on SCr of 0.78 mg/dL). Liver Function Tests: Recent Labs  Lab 12/03/16 1348 12/03/16 2240  AST 16 18  ALT 10* 10*  ALKPHOS 95 100  BILITOT 0.7 0.7  PROT 7.4 7.8  ALBUMIN 4.6 4.9   Recent Labs  Lab 12/03/16 2240  LIPASE 28   No results for input(s): AMMONIA in the last 168 hours. Coagulation Profile: No results for input(s): INR, PROTIME in the last 168 hours. Cardiac Enzymes: No results for input(s): CKTOTAL, CKMB, CKMBINDEX, TROPONINI in the last 168 hours. BNP (last 3 results) No results for input(s): PROBNP in the last 8760  hours. HbA1C: No results for input(s): HGBA1C in the last 72 hours. CBG: Recent Labs  Lab 12/04/16 0240  GLUCAP 93  Lipid Profile: No results for input(s): CHOL, HDL, LDLCALC, TRIG, CHOLHDL, LDLDIRECT in the last 72 hours. Thyroid Function Tests: No results for input(s): TSH, T4TOTAL, FREET4, T3FREE, THYROIDAB in the last 72 hours. Anemia Panel: No results for input(s): VITAMINB12, FOLATE, FERRITIN, TIBC, IRON, RETICCTPCT in the last 72 hours. Urine analysis: No results found for: COLORURINE, APPEARANCEUR, LABSPEC, PHURINE, GLUCOSEU, HGBUR, BILIRUBINUR, KETONESUR, PROTEINUR, UROBILINOGEN, NITRITE, LEUKOCYTESUR  Radiological Exams on Admission: Ct Abdomen Pelvis W Contrast  Result Date: 12/03/2016 CLINICAL DATA:  Generalized abdominal pain with burning for 2 weeks, history of diabetes mellitus, CHF, COPD, hypertension EXAM: CT ABDOMEN AND PELVIS WITH CONTRAST TECHNIQUE: Multidetector CT imaging of the abdomen and pelvis was performed using the standard protocol following bolus administration of intravenous contrast. Sagittal and coronal MPR images reconstructed from axial data set. CONTRAST:  177mL ISOVUE-300 IOPAMIDOL (ISOVUE-300) INJECTION 61% IV. Dilute oral contrast. COMPARISON:  None FINDINGS: Lower chest: Lung bases clear Hepatobiliary: Gallbladder and liver normal appearance Pancreas: Normal appearance Spleen: Normal appearance Adrenals/Urinary Tract: BILATERAL renal cysts largest upper pole LEFT kidney 3.6 x 3.3 cm image 20. Scattered renal vascular calcifications bilaterally. No hydronephrosis or hydroureter. Bladder decompressed. Stomach/Bowel: Low-lying cecum in pelvis. Normal appendix anterior mid sacrum. Few dilated small bowel loops in the LEFT mid abdomen with decompressed distal small bowel loops consistent with small bowel obstruction. Transition from dilated to nondilated small bowel occurs in the upper LEFT pelvis best appreciated coronal image 58 where mild wall thickening  is seen. This is nonspecific, could be due to inflammatory process, tumor or scarring. Colon decompressed and unremarkable. No definite gastric abnormalities the proximal stomach is collapsed and poorly assessed. Vascular/Lymphatic: Atherosclerotic calcifications aorta and iliac arteries without aneurysm. Few coronary artery calcifications incidentally noted. No definite adenopathy. Reproductive: Uterus surgically absent. Normal sized ovaries bilaterally. Other: Small amount of free pelvic fluid. Small umbilical hernia containing fat. No definite free air. Musculoskeletal: Prior lumbar fusion L4-S1. IMPRESSION: Mid small bowel obstruction with transition zone in the upper LEFT pelvis where wall bowel wall thickening is identified, question due to inflammatory process, scarring, or tumor. BILATERAL renal cysts. Small umbilical hernia containing fat. Scattered atherosclerotic calcifications as above. Aortic Atherosclerosis (ICD10-I70.0). Electronically Signed   By: Lavonia Dana M.D.   On: 12/03/2016 17:27    EKG: Independently reviewed.   Assessment/Plan Principal Problem:   SBO (small bowel obstruction) (HCC) Admit to MedSurg/observation. Continue IV fluids. Keep n.p.o. Low-dose analgesics as needed. Continue antiemetics. Consult general surgery in the morning.  Active Problems:   Anemia\ Last month ferritin was low at 61 ng/mL. And folic acid was 854.6 ng/mL. Monitor hematocrit and hemoglobin.    Diarrhea No further diarrhea since Sunday. Continue IV hydration.    Sleep apnea She declined BiPAP this morning. She agreed to use it tonight.    Hypercholesterolemia Resume atorvastatin once cleared for oral intake.    GERD (gastroesophageal reflux disease) Protonix 40 mg IVP every 24 hours. Switch to p.o. Protonix once cleared for oral intake.    HTN (hypertension) Hold all antihypertensives. Hydralazine 10 mg every 4 hours as needed for SBP over 160.    COPD (chronic obstructive  pulmonary disease) (HCC) Supplemental oxygen bronchodilators as needed.    Diabetes ([place]) Currently n.p.o. Monitor CBC every 6 hours.    DVT prophylaxis: SCDs. Code Status: Full code. Family Communication:  Disposition Plan: Admit for IV hydration, symptoms management General surgery evaluation in a.m. Consults called: Routine general surgery consult. Admission status: Observation/MedSurg.   Reubin Milan MD  [facility] Pager [phone].  If 7PM-7AM, please contact night-coverage [email or web address] Password [facility]  12/04/2016, 5:17 AM

## 2016-12-04 NOTE — ED Provider Notes (Signed)
[facility] EMERGENCY DEPARTMENT Provider Note   CSN: [id number] Arrival date & time: 12/03/16  1940     History   Chief Complaint Chief Complaint  Patient presents with  . Abdominal Pain    HPI Samariah Hokenson is a 71 y.o. female.  The history is provided by the patient.  She has a history of GERD, hyperlipidemia, diabetes, COPD, CHF, coronary artery disease.  She has been having crampy upper abdominal pain for about the last 2 weeks, worse over the last week.  She has not had any nausea or vomiting.  She has had diarrhea for about the last week.  Nothing makes pain better, nothing makes it worse.  She denies fever or chills.  She saw her PCP who ordered a CT scan which showed a small bowel obstruction and she was referred to the emergency department.  She had not done anything at home to treat her symptoms.  Past Medical History:  Diagnosis Date  . Arthritis   . Asthma   . CHF (congestive heart failure) ([place])   . Constipation   . COPD (chronic obstructive pulmonary disease) ([place])   . Coronary artery disease   . Diabetes ([place])   . Dyspnea    exertion  . GERD (gastroesophageal reflux disease)   . 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 HTN (hypertension)   . Hypercholesterolemia   . IDA (iron deficiency anemia)   . Neuromuscular disorder ([place])   . Sleep apnea    uses bipap    Patient Active Problem List   Diagnosis Date Noted  . Diarrhea 12/03/2016  . Abdominal pain of multiple sites 12/03/2016  . Anemia 07/17/2016    Past Surgical History:  Procedure Laterality Date  . BACK SURGERY    . BIOPSY  08/13/2016   Procedure: BIOPSY;  Surgeon: Danie Binder, MD;  Location: [facility] ENDO SU[facility];  Service: Endoscopy;;  gastric duodenal  . CAROTID ENDARTERECTOMY    . COLONOSCOPY  2014   normal (?) she is not sure  . COLONOSCOPY WITH PROPOFOL N/A 08/13/2016   Dr. Oneida Alar: one 4 mm tubular adenoma at hepatic flexure, mild diverticulosis in mid ascending colon, extremely redundant left colon,  external and internal hemorrhoids, surveillance in 5-10 years  . ECTOPIC PREGNANCY SURGERY    . ENTEROSCOPY N/A 10/15/2016   single angioectasia with no bleeding in second portion of duodenum, s/p APC. 2 angioectasias without bleeding in proximal jejunum, s/p APC  . ESOPHAGOGASTRODUODENOSCOPY (EGD) WITH PROPOFOL N/A 08/13/2016   Dr. Oneida Alar: widely patent Schatzki ring, mild gastritis/duodenitis, small hiatal hernia, path with chroni gastritis, negatice celiac sprue  . fibroid tumor    . GIVENS CAPSULE STUDY N/A 09/10/2016   small bowel AVMs  . HOT HEMOSTASIS  10/15/2016   Procedure: HOT HEMOSTASIS (ARGON PLASMA COAGULATION/BICAP);  Surgeon: Danie Binder, MD;  Location: [facility];  Service: Endoscopy;;  small bowel  . lower extremity bypass    . PARTIAL HYSTERECTOMY    . POLYPECTOMY  08/13/2016   Procedure: POLYPECTOMY;  Surgeon: Danie Binder, MD;  Location: [facility];  Service: Endoscopy;;  colon     OB History    No data available       Home Medications    Prior to Admission medications   Medication Sig Start Date End Date Taking? Authorizing Provider  Albuterol Sulfate 108 (90 Base) MCG/ACT AEPB Inhale 1 puff into the lungs 2 (two) times daily as needed (wheezing).    Yes [provider]  alendronate (FOSAMAX) 70 MG tablet Take 70 mg by mouth once a week. Every Tuesday 05/11/16  Yes [provider]  aspirin EC 81 MG tablet Take 81 mg by mouth daily.   Yes [provider]  atorvastatin (LIPITOR) 40 MG tablet Take 1 tablet by mouth daily. 04/17/11  Yes [provider]  Calcium Carbonate-Vitamin D (CALTRATE 600+D PO) Take 1 tablet by mouth daily.    Yes [provider]  carvedilol (COREG) 25 MG tablet Take 6.25 mg by mouth 2 (two) times daily with a meal.    Yes [provider]  ferrous sulfate 325 (65 FE) MG EC tablet Take 325 mg by mouth 2 (two) times daily.   Yes [provider]  folic acid (FOLVITE) 1 MG tablet  Take 1 tablet by mouth daily. 04/16/11  Yes [provider]  hydrOXYzine (VISTARIL) 25 MG capsule Take 25 mg by mouth at bedtime.   Yes [provider]  INCRUSE ELLIPTA 62.5 MCG/INH AEPB INHALE ONE PUFF BY MOUTH TWICE DAILY 05/01/16  Yes [provider]  ipratropium (ATROVENT) 0.06 % nasal spray INSTILL 1 SPRAY IN BOTH NOSTRILS EVERY 4 HOURS AS NEEDED 06/14/16  Yes [provider]  ipratropium-albuterol (DUONEB) 0.5-2.5 (3) MG/3ML SOLN Inhale 3 mLs into the lungs at bedtime. *MAY USE DAILY AS NEEDED FOR SHORTNESS OF BREATH/WHEEZING 10/02/16  Yes [provider]  LINZESS 145 MCG CAPS capsule Take 145 mcg by mouth daily as needed (IBS, constipation).  08/07/16  Yes [provider]  losartan (COZAAR) 100 MG tablet Take 50 mg by mouth daily.    Yes [provider]  MAGNESIUM-OXIDE 400 (241.3 Mg) MG tablet TAKE 1 TABLET BY MOUTH TWICE DAILY 06/27/16  Yes [provider]  metFORMIN (GLUCOPHAGE) 500 MG tablet Take 500 mg by mouth daily (with breakfast). Take with meal    Yes [provider]  nitroGLYCERIN (NITROSTAT) 0.4 MG SL tablet Place 0.4 mg under the tongue every 5 (five) minutes as needed for chest pain.   Yes [provider]  olopatadine (PATANOL) 0.1 % ophthalmic solution Place 1 drop into both eyes at bedtime.   Yes [provider]  omeprazole (PRILOSEC) 20 MG capsule Take 1 capsule (20 mg total) by mouth daily. 30 minutes before breakfast 07/17/16  Yes Annitta Needs, NP  spironolactone (ALDACTONE) 25 MG tablet Take 1 tablet by mouth daily. 04/22/16  Yes [provider]  tiZANidine (ZANAFLEX) 4 MG tablet Take 8 mg by mouth at bedtime.    Yes [provider]  Topiramate ER (QUDEXY XR) 25 MG CS24 sprinkle cap Take 2 capsules by mouth at bedtime.   Yes [provider]  traMADol (ULTRAM) 50 MG tablet Take 50 mg by mouth TWICE DAILY   Yes [provider]  traZODone (DESYREL) 50 MG  tablet Take 50 mg by mouth at bedtime. 08/23/16  Yes [provider]    Family History Family History  Problem Relation Age of Onset  . Colon cancer Neg Hx        no first degree relatives. States cousin had colon cancer  . Colon polyps Neg Hx     Social History Social History   Tobacco Use  . Smoking status: Former Smoker    Types: Cigarettes    Last attempt to quit: 2002    Years since quitting: 16.9  . Smokeless tobacco: Never Used  Substance Use Topics  . Alcohol use: No  . Drug use: No  Allergies   Levofloxacin   Review of Systems Review of Systems  All other systems reviewed and are negative.    Physical Exam Updated Vital Signs BP (!) 184/65 (BP Location: Left Arm)   Pulse 69   Temp 99.1 F (37.3 C)   Resp 18   Ht 5\' 3"  (1.6 m)   Wt 85.7 kg (189 lb)   SpO2 100%   BMI 33.48 kg/m   Physical Exam  Nursing note and vitals reviewed.  71 year old female, resting comfortably and in no acute distress. Vital signs are significant for hypertension. Oxygen saturation is 100%, which is normal. Head is normocephalic and atraumatic. PERRLA, EOMI. Oropharynx is clear. Neck is nontender and supple without adenopathy or JVD. Back is nontender and there is no CVA tenderness. Lungs are clear without rales, wheezes, or rhonchi. Chest is nontender. Heart has regular rate and rhythm without murmur. Abdomen is soft, flat, nontender without masses or hepatosplenomegaly and peristalsis is slightly hypoactive. Extremities have no cyanosis or edema, full range of motion is present. Skin is warm and dry without rash. Neurologic: Mental status is normal, cranial nerves are intact, there are no motor or sensory deficits.  ED Treatments / Results  Labs (all labs ordered are listed, but only abnormal results are displayed) Labs Reviewed  CBC WITH DIFFERENTIAL/PLATELET - Abnormal; Notable for the following components:      Result Value   Hemoglobin 10.9 (*)     MCH 25.2 (*)    MCHC 29.7 (*)    All other components within normal limits  COMPREHENSIVE METABOLIC PANEL - Abnormal; Notable for the following components:   Potassium 3.1 (*)    Glucose, Bld 101 (*)    ALT 10 (*)    All other components within normal limits  LIPASE, BLOOD    Radiology Ct Abdomen Pelvis W Contrast  Result Date: 12/03/2016 CLINICAL DATA:  Generalized abdominal pain with burning for 2 weeks, history of diabetes mellitus, CHF, COPD, hypertension EXAM: CT ABDOMEN AND PELVIS WITH CONTRAST TECHNIQUE: Multidetector CT imaging of the abdomen and pelvis was performed using the standard protocol following bolus administration of intravenous contrast. Sagittal and coronal MPR images reconstructed from axial data set. CONTRAST:  161mL ISOVUE-300 IOPAMIDOL (ISOVUE-300) INJECTION 61% IV. Dilute oral contrast. COMPARISON:  None FINDINGS: Lower chest: Lung bases clear Hepatobiliary: Gallbladder and liver normal appearance Pancreas: Normal appearance Spleen: Normal appearance Adrenals/Urinary Tract: BILATERAL renal cysts largest upper pole LEFT kidney 3.6 x 3.3 cm image 20. Scattered renal vascular calcifications bilaterally. No hydronephrosis or hydroureter. Bladder decompressed. Stomach/Bowel: Low-lying cecum in pelvis. Normal appendix anterior mid sacrum. Few dilated small bowel loops in the LEFT mid abdomen with decompressed distal small bowel loops consistent with small bowel obstruction. Transition from dilated to nondilated small bowel occurs in the upper LEFT pelvis best appreciated coronal image 58 where mild wall thickening is seen. This is nonspecific, could be due to inflammatory process, tumor or scarring. Colon decompressed and unremarkable. No definite gastric abnormalities the proximal stomach is collapsed and poorly assessed. Vascular/Lymphatic: Atherosclerotic calcifications aorta and iliac arteries without aneurysm. Few coronary artery calcifications incidentally noted. No definite  adenopathy. Reproductive: Uterus surgically absent. Normal sized ovaries bilaterally. Other: Small amount of free pelvic fluid. Small umbilical hernia containing fat. No definite free air. Musculoskeletal: Prior lumbar fusion L4-S1. IMPRESSION: Mid small bowel obstruction with transition zone in the upper LEFT pelvis where wall bowel wall thickening is identified, question due to inflammatory process, scarring, or tumor. BILATERAL renal  cysts. Small umbilical hernia containing fat. Scattered atherosclerotic calcifications as above. Aortic Atherosclerosis (ICD10-I70.0). Electronically Signed   By: Lavonia Dana M.D.   On: 12/03/2016 17:27    Procedures Procedures (including critical care time)  Medications Ordered in ED Medications  potassium chloride 10 mEq in 100 mL IVPB (not administered)  sodium chloride 0.9 % bolus 1,000 mL (1,000 mLs Intravenous New Bag/Given 12/03/16 2240)  ondansetron (ZOFRAN) injection 4 mg (4 mg Intravenous Given 12/03/16 2240)  morphine 4 MG/ML injection 4 mg (4 mg Intravenous Given 12/03/16 2240)     Initial Impression / Assessment and Plan / ED Course  I have reviewed the triage vital signs and the nursing notes.  Pertinent labs & imaging results that were available during my care of the patient were reviewed by me and considered in my medical decision making (see chart for details).  Abdominal pain with CT findings suggestive of small bowel obstruction.  Curiously, she has not had nausea or vomiting with this.  Old records are reviewed, and she has no relevant past visits.  She is noted to be hypokalemic and is given intravenous potassium.  She has been evaluated by Dr. Oneida Alar of GI in the past, and did have a small bowel endoscopy to evaluate iron deficiency anemia.  Case is discussed with Dr. Olevia Bowens of [facility] hospitalist who agrees to admit the patient.  Probably will need consultation with GI and general surgery.  Final Clinical Impressions(s) / ED Diagnoses    Final diagnoses:  Small bowel obstruction ([place])  Hypokalemia  Microcytic anemia    ED Discharge Orders    None       Delora Fuel, MD 85/90/93 [id number]

## 2016-12-04 NOTE — Consult Note (Signed)
[name]  Reason for Consult: SBO Referring Physician:  Dr. Clementeen Graham, Roseanne Kaufman PA (GI)   Chief Complaint    Abdominal Pain      Christine Bolton is a 71 y.o. female.  HPI: Christine Bolton is a very pleasant 71 yo with multiple medical issues including COPD, DM, CHF, CAD, HTN who presented with abdominal pain and diarrhea for about 1 weeks time to he GI doctors. She underwent a CT a/p and was found to have a SBO with a transition point in the distal jejunum caused by a thickened small bowel.  She has been seeing Gi for the last several months for anemia and melena with an extensive workup including colonoscopy and EGD 08/2016 showing a tubular adenoma that was removed, diverticulosis, and a redundant colon, and gastritis/ duodenitis.  She then underwent a capsule study that was reported to be normal, and a small bowel enteroscopy through the proximal jejunum with argon cauterization of 3 AVMs, one in duodenum and two in the jejunum.  This last study was approximately 6 weeks ago, and then the patient presented with these symptoms.  She reports that her normal bowel pattern is constipated and that she has required linzess in the past, and that having diarhea in the last week was new.  She had her last BM on Sunday/ Monday, and has since not had a BM.  She has had nausea but no vomiting.    She has been to see her Cardiologist, Dr. Alroy Dust in [place] in 08/2016, and reports having a recent stress test that was normal and wearing a monitor for a month which was normal.   Past Medical History:  Diagnosis Date  . Arthritis   . Asthma   . CHF (congestive heart failure) ([place])   . Constipation   . COPD (chronic obstructive pulmonary disease) ([place])   . Coronary artery disease   . Diabetes ([place])   . Dyspnea    exertion  . GERD (gastroesophageal reflux disease)   . 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 HTN (hypertension)   . Hypercholesterolemia   . IDA (iron deficiency anemia)   .  Neuromuscular disorder ([place])   . Sleep apnea    uses bipap    Past Surgical History:  Procedure Laterality Date  . BACK SURGERY    . BIOPSY  08/13/2016   Procedure: BIOPSY;  Surgeon: Danie Binder, MD;  [facility];  Service: Endoscopy;;  gastric duodenal  . CAROTID ENDARTERECTOMY    . COLONOSCOPY  2014   normal (?) she is not sure  . COLONOSCOPY WITH PROPOFOL N/A 08/13/2016   Dr. Oneida Alar: one 4 mm tubular adenoma at hepatic flexure, mild diverticulosis in mid ascending colon, extremely redundant left colon, external and internal hemorrhoids, surveillance in 5-10 years  . ECTOPIC PREGNANCY SURGERY    . ENTEROSCOPY N/A 10/15/2016   single angioectasia with no bleeding in second portion of duodenum, s/p APC. 2 angioectasias without bleeding in proximal jejunum, s/p APC  . ESOPHAGOGASTRODUODENOSCOPY (EGD) WITH PROPOFOL N/A 08/13/2016   Dr. Oneida Alar: widely patent Schatzki ring, mild gastritis/duodenitis, small hiatal hernia, path with chroni gastritis, negatice celiac sprue  . fibroid tumor    . GIVENS CAPSULE STUDY N/A 09/10/2016   small bowel AVMs  . HOT HEMOSTASIS  10/15/2016   Procedure: HOT HEMOSTASIS (ARGON PLASMA COAGULATION/BICAP);  Surgeon: Danie Binder, MD;  [facility];  Service: Endoscopy;;  small bowel  . lower extremity bypass    .  PARTIAL HYSTERECTOMY    . POLYPECTOMY  08/13/2016   Procedure: POLYPECTOMY;  Surgeon: Danie Binder, MD;  [facility];  Service: Endoscopy;;  colon     Family History  Problem Relation Age of Onset  . Diabetes Mellitus II Mother   . Heart attack Father   . Heart disease Sister   . Heart attack Brother   . Colon cancer Neg Hx        no first degree relatives. States cousin had colon cancer  . Colon polyps Neg Hx     Social History   Tobacco Use  . Smoking status: Former Smoker    Types: Cigarettes    Last attempt to quit: 2002    Years since quitting: 16.9  . Smokeless tobacco: Never Used  Substance  Use Topics  . Alcohol use: No  . Drug use: No    Medications:  I have reviewed the patient's current medications. Prior to Admission:  Medications Prior to Admission  Medication Sig Dispense Refill Last Dose  . Albuterol Sulfate 108 (90 Base) MCG/ACT AEPB Inhale 1 puff into the lungs 2 (two) times daily as needed (wheezing).    Past Week at Unknown time  . alendronate (FOSAMAX) 70 MG tablet Take 70 mg by mouth once a week. Every Tuesday  2 11/26/2016 at Unknown time  . aspirin EC 81 MG tablet Take 81 mg by mouth daily.   12/02/2016 at Unknown time  . atorvastatin (LIPITOR) 40 MG tablet Take 1 tablet by mouth daily.   12/02/2016 at Unknown time  . Calcium Carbonate-Vitamin D (CALTRATE 600+D PO) Take 1 tablet by mouth daily.    12/02/2016 at Unknown time  . carvedilol (COREG) 25 MG tablet Take 6.25 mg by mouth 2 (two) times daily with a meal.    12/02/2016 at Unknown time  . ferrous sulfate 325 (65 FE) MG EC tablet Take 325 mg by mouth 2 (two) times daily.   12/02/2016 at Unknown time  . folic acid (FOLVITE) 1 MG tablet Take 1 tablet by mouth daily.   12/02/2016 at Unknown time  . hydrOXYzine (VISTARIL) 25 MG capsule Take 25 mg by mouth at bedtime.   12/02/2016 at Unknown time  . INCRUSE ELLIPTA 62.5 MCG/INH AEPB INHALE ONE PUFF BY MOUTH TWICE DAILY  3 12/02/2016 at Unknown time  . ipratropium (ATROVENT) 0.06 % nasal spray INSTILL 1 SPRAY IN BOTH NOSTRILS EVERY 4 HOURS AS NEEDED  2 Past Week at Unknown time  . ipratropium-albuterol (DUONEB) 0.5-2.5 (3) MG/3ML SOLN Inhale 3 mLs into the lungs at bedtime. *MAY USE DAILY AS NEEDED FOR SHORTNESS OF BREATH/WHEEZING  0 12/02/2016 at Unknown time  . LINZESS 145 MCG CAPS capsule Take 145 mcg by mouth daily as needed (IBS, constipation).   3 UNKNOWN  . losartan (COZAAR) 100 MG tablet Take 50 mg by mouth daily.    12/02/2016 at Unknown time  . MAGNESIUM-OXIDE 400 (241.3 Mg) MG tablet TAKE 1 TABLET BY MOUTH TWICE DAILY  3 12/02/2016 at Unknown time  .  metFORMIN (GLUCOPHAGE) 500 MG tablet Take 500 mg by mouth daily (with breakfast). Take with meal    12/02/2016 at Unknown time  . nitroGLYCERIN (NITROSTAT) 0.4 MG SL tablet Place 0.4 mg under the tongue every 5 (five) minutes as needed for chest pain.   unknown  . olopatadine (PATANOL) 0.1 % ophthalmic solution Place 1 drop into both eyes at bedtime.   12/02/2016 at Unknown time  . omeprazole (PRILOSEC) 20 MG  capsule Take 1 capsule (20 mg total) by mouth daily. 30 minutes before breakfast 90 capsule 3 12/02/2016 at Unknown time  . spironolactone (ALDACTONE) 25 MG tablet Take 1 tablet by mouth daily.  2 12/02/2016 at Unknown time  . tiZANidine (ZANAFLEX) 4 MG tablet Take 8 mg by mouth at bedtime.    12/02/2016 at Unknown time  . Topiramate ER (QUDEXY XR) 25 MG CS24 sprinkle cap Take 2 capsules by mouth at bedtime.   12/02/2016 at Unknown time  . traMADol (ULTRAM) 50 MG tablet Take 50 mg by mouth TWICE DAILY   12/02/2016 at Unknown time  . traZODone (DESYREL) 50 MG tablet Take 50 mg by mouth at bedtime.  1 12/02/2016 at Unknown time   Scheduled: . hydrOXYzine  25 mg Oral QHS  . [START ON 12/05/2016] Influenza vac split quadrivalent PF  0.5 mL Intramuscular Tomorrow-1000  . insulin aspart  0-9 Units Subcutaneous Q4H  . olopatadine  1 drop Both Eyes QHS  . pantoprazole (PROTONIX) IV  40 mg Intravenous Q24H  . tiZANidine  8 mg Oral QHS  . topiramate  50 mg Oral QHS  . traZODone  50 mg Oral QHS   Continuous: . dextrose 5 % and 0.9 % NaCl with KCl 20 mEq/L 100 mL/hr at 12/04/16 0126   Allergies  Allergen Reactions  . Levofloxacin Other (See Comments)    Increase heart rate    ROS:  A comprehensive review of systems was negative except for: Cardiovascular: positive for history of CHF not on lasix Gastrointestinal: positive for abdominal pain, constipation, diarrhea and nausea  Blood pressure (!) 157/56, pulse 61, temperature 98.3 F (36.8 C), temperature source Oral, resp. rate 18, height  '5\' 3"'$  (1.6 m), weight 185 lb (83.9 kg), SpO2 100 %. Physical Exam  Results: Results for orders placed or performed during the hospital encounter of 12/03/16 (from the past 48 hour(s))  CBC with Differential     Status: Abnormal   Collection Time: 12/03/16 10:40 PM  Result Value Ref Range   WBC 6.8 4.0 - 10.5 K/uL   RBC 4.32 3.87 - 5.11 MIL/uL   Hemoglobin 10.9 (L) 12.0 - 15.0 g/dL   HCT 36.7 36.0 - 46.0 %   MCV 85.0 78.0 - 100.0 fL   MCH 25.2 (L) 26.0 - 34.0 pg   MCHC 29.7 (L) 30.0 - 36.0 g/dL   RDW 13.3 11.5 - 15.5 %   Platelets 252 150 - 400 K/uL   Neutrophils Relative % 64 %   Neutro Abs 4.3 1.7 - 7.7 K/uL   Lymphocytes Relative 17 %   Lymphs Abs 1.2 0.7 - 4.0 K/uL   Monocytes Relative 9 %   Monocytes Absolute 0.6 0.1 - 1.0 K/uL   Eosinophils Relative 9 %   Eosinophils Absolute 0.6 0.0 - 0.7 K/uL   Basophils Relative 1 %   Basophils Absolute 0.0 0.0 - 0.1 K/uL  Comprehensive metabolic panel     Status: Abnormal   Collection Time: 12/03/16 10:40 PM  Result Value Ref Range   Sodium 138 135 - 145 mmol/L   Potassium 3.1 (L) 3.5 - 5.1 mmol/L    Comment: DELTA CHECK NOTED   Chloride 102 101 - 111 mmol/L   CO2 28 22 - 32 mmol/L   Glucose, Bld 101 (H) 65 - 99 mg/dL   BUN 11 6 - 20 mg/dL   Creatinine, Ser 0.78 0.44 - 1.00 mg/dL   Calcium 10.0 8.9 - 10.3 mg/dL   Total Protein 7.8  6.5 - 8.1 g/dL   Albumin 4.9 3.5 - 5.0 g/dL   AST 18 15 - 41 U/L   ALT 10 (L) 14 - 54 U/L   Alkaline Phosphatase 100 38 - 126 U/L   Total Bilirubin 0.7 0.3 - 1.2 mg/dL   GFR calc non Af Amer >60 >60 mL/min   GFR calc Af Amer >60 >60 mL/min    Comment: (NOTE) The eGFR has been calculated using the CKD EPI equation. This calculation has not been validated in all clinical situations. eGFR's persistently <60 mL/min signify possible Chronic Kidney Disease.    Anion gap 8 5 - 15  Lipase, blood     Status: None   Collection Time: 12/03/16 10:40 PM  Result Value Ref Range   Lipase 28 11 - 51 U/L   Magnesium     Status: None   Collection Time: 12/03/16 10:40 PM  Result Value Ref Range   Magnesium 1.8 1.7 - 2.4 mg/dL  Glucose, capillary     Status: None   Collection Time: 12/04/16  2:40 AM  Result Value Ref Range   Glucose-Capillary 93 65 - 99 mg/dL  CBC WITH DIFFERENTIAL     Status: Abnormal   Collection Time: 12/04/16  5:59 AM  Result Value Ref Range   WBC 6.3 4.0 - 10.5 K/uL   RBC 3.99 3.87 - 5.11 MIL/uL   Hemoglobin 10.1 (L) 12.0 - 15.0 g/dL   HCT 34.2 (L) 36.0 - 46.0 %   MCV 85.7 78.0 - 100.0 fL   MCH 25.3 (L) 26.0 - 34.0 pg   MCHC 29.5 (L) 30.0 - 36.0 g/dL   RDW 13.3 11.5 - 15.5 %   Platelets 213 150 - 400 K/uL   Neutrophils Relative % 62 %   Neutro Abs 3.9 1.7 - 7.7 K/uL   Lymphocytes Relative 19 %   Lymphs Abs 1.2 0.7 - 4.0 K/uL   Monocytes Relative 11 %   Monocytes Absolute 0.7 0.1 - 1.0 K/uL   Eosinophils Relative 8 %   Eosinophils Absolute 0.5 0.0 - 0.7 K/uL   Basophils Relative 0 %   Basophils Absolute 0.0 0.0 - 0.1 K/uL  Comprehensive metabolic panel     Status: Abnormal   Collection Time: 12/04/16  5:59 AM  Result Value Ref Range   Sodium 141 135 - 145 mmol/L   Potassium 4.0 3.5 - 5.1 mmol/L    Comment: DELTA CHECK NOTED REPEAT/RECALIBRATE RESULT REPEATED AND VERIFIED    Chloride 109 101 - 111 mmol/L   CO2 25 22 - 32 mmol/L   Glucose, Bld 113 (H) 65 - 99 mg/dL   BUN 8 6 - 20 mg/dL   Creatinine, Ser 0.80 0.44 - 1.00 mg/dL   Calcium 9.2 8.9 - 10.3 mg/dL   Total Protein 6.7 6.5 - 8.1 g/dL   Albumin 4.2 3.5 - 5.0 g/dL   AST 15 15 - 41 U/L   ALT 10 (L) 14 - 54 U/L   Alkaline Phosphatase 95 38 - 126 U/L   Total Bilirubin 0.6 0.3 - 1.2 mg/dL   GFR calc non Af Amer >60 >60 mL/min   GFR calc Af Amer >60 >60 mL/min    Comment: (NOTE) The eGFR has been calculated using the CKD EPI equation. This calculation has not been validated in all clinical situations. eGFR's persistently <60 mL/min signify possible Chronic Kidney Disease.    Anion gap 7 5 -  15  Glucose, capillary     Status: Abnormal  Collection Time: 12/04/16  6:02 AM  Result Value Ref Range   Glucose-Capillary 106 (H) 65 - 99 mg/dL  Glucose, capillary     Status: Abnormal   Collection Time: 12/04/16 12:10 PM  Result Value Ref Range   Glucose-Capillary 121 (H) 65 - 99 mg/dL   Comment 1 Notify RN    Comment 2 Document in Chart    Personally reviewed the CT scan and GI workup- Reviewed CT with Dr. Thornton Papas in Radiology -patient with a area of small bowel with obstruction in the left lower quadrant from a thickened area of small bowel, could be infectious, inflammatory, or cancerous in nature, no lymphadenopathy, SB dilated to 4.5cm, difficult to assess if contrast passes the point of obstruction, but subsequent Xray shows contrast in colon   Ct Abdomen Pelvis W Contrast  Result Date: 12/03/2016 CLINICAL DATA:  Generalized abdominal pain with burning for 2 weeks, history of diabetes mellitus, CHF, COPD, hypertension EXAM: CT ABDOMEN AND PELVIS WITH CONTRAST TECHNIQUE: Multidetector CT imaging of the abdomen and pelvis was performed using the standard protocol following bolus administration of intravenous contrast. Sagittal and coronal MPR images reconstructed from axial data set. CONTRAST:  18m ISOVUE-300 IOPAMIDOL (ISOVUE-300) INJECTION 61% IV. Dilute oral contrast. COMPARISON:  None FINDINGS: Lower chest: Lung bases clear Hepatobiliary: Gallbladder and liver normal appearance Pancreas: Normal appearance Spleen: Normal appearance Adrenals/Urinary Tract: BILATERAL renal cysts largest upper pole LEFT kidney 3.6 x 3.3 cm image 20. Scattered renal vascular calcifications bilaterally. No hydronephrosis or hydroureter. Bladder decompressed. Stomach/Bowel: Low-lying cecum in pelvis. Normal appendix anterior mid sacrum. Few dilated small bowel loops in the LEFT mid abdomen with decompressed distal small bowel loops consistent with small bowel obstruction. Transition from dilated to nondilated  small bowel occurs in the upper LEFT pelvis best appreciated coronal image 58 where mild wall thickening is seen. This is nonspecific, could be due to inflammatory process, tumor or scarring. Colon decompressed and unremarkable. No definite gastric abnormalities the proximal stomach is collapsed and poorly assessed. Vascular/Lymphatic: Atherosclerotic calcifications aorta and iliac arteries without aneurysm. Few coronary artery calcifications incidentally noted. No definite adenopathy. Reproductive: Uterus surgically absent. Normal sized ovaries bilaterally. Other: Small amount of free pelvic fluid. Small umbilical hernia containing fat. No definite free air. Musculoskeletal: Prior lumbar fusion L4-S1. IMPRESSION: Mid small bowel obstruction with transition zone in the upper LEFT pelvis where wall bowel wall thickening is identified, question due to inflammatory process, scarring, or tumor. BILATERAL renal cysts. Small umbilical hernia containing fat. Scattered atherosclerotic calcifications as above. Aortic Atherosclerosis (ICD10-I70.0). Electronically Signed   By: MLavonia DanaM.D.   On: 12/03/2016 17:27   Dg Abd Portable 2v  Result Date: 12/04/2016 CLINICAL DATA:  Small bowel loops EXAM: PORTABLE ABDOMEN - 2 VIEW COMPARISON:  12/03/2016 FINDINGS: There is no free intraperitoneal gas on the upright view. Contrast has traversed into the colon. Distended loops of small and large bowel persist. Lumbar stabilization hardware remains in place. IMPRESSION: There is continued distention of both small and large bowel, however contrast has traversed into the colon excluding complete obstruction of small bowel. Findings may reflect ileus or partial small bowel obstruction. Electronically Signed   By: AMarybelle KillingsM.D.   On: 12/04/2016 10:37    Assessment & Plan:  LElienai Gaileyis a 71y.o. female with SBO with a focal area of thickened small bowel causing the obstruction for unknown etiologies.  I have discussed the  case with Dr. BThornton Papasand ARoseanne Kaufman  Ms. BCyndi Benderhas  gone back and looked at the capsule study and reports there was significant debris so some pathology could have been missed. Dr. Thornton Papas feels like this could be inflammatory/ infectious but also could be a cancer given the characteristics of a shelf like appearance of the mucosa.  I have discussed all of these possibilities with the patient and her family.  Xray from today with contrast to the colon which means this is not a complete obstruction.    -Will monitor clinically, if improves and has BMs and able to tolerate a diet would plan to repeat a CT enterography in 4 weeks to assess this area of bowel. If the bowel is still thickened then would recommend surgery to diagnose the etiology.  -If no improvement clinically, then will need a Ex lap and SBR to alleviate the obstruction and also establish a diagnosis  -Patient abdominal exam relatively benign, labs ok, no acute surgical intervention indicated  -Continue NPO, if vomits put in NG tube  Discussed with Dr. Clementeen Graham.   All questions were answered to the satisfaction of the patient and family  Virl Cagey 12/04/2016, 1:05 PM

## 2016-12-05 ENCOUNTER — Inpatient Hospital Stay (HOSPITAL_COMMUNITY): Payer: Medicare HMO

## 2016-12-05 DIAGNOSIS — E876 Hypokalemia: Secondary | ICD-10-CM

## 2016-12-05 DIAGNOSIS — I2510 Atherosclerotic heart disease of native coronary artery without angina pectoris: Secondary | ICD-10-CM

## 2016-12-05 DIAGNOSIS — E118 Type 2 diabetes mellitus with unspecified complications: Secondary | ICD-10-CM

## 2016-12-05 DIAGNOSIS — I10 Essential (primary) hypertension: Secondary | ICD-10-CM

## 2016-12-05 LAB — CBC WITH DIFFERENTIAL/PLATELET
BASOS ABS: 0 10*3/uL (ref 0.0–0.1)
BASOS PCT: 0 %
EOS PCT: 9 %
Eosinophils Absolute: 0.5 10*3/uL (ref 0.0–0.7)
HEMATOCRIT: 32.1 % — AB (ref 36.0–46.0)
Hemoglobin: 9.6 g/dL — ABNORMAL LOW (ref 12.0–15.0)
LYMPHS PCT: 18 %
Lymphs Abs: 0.9 10*3/uL (ref 0.7–4.0)
MCH: 25.4 pg — ABNORMAL LOW (ref 26.0–34.0)
MCHC: 29.9 g/dL — ABNORMAL LOW (ref 30.0–36.0)
MCV: 84.9 fL (ref 78.0–100.0)
MONO ABS: 0.5 10*3/uL (ref 0.1–1.0)
MONOS PCT: 10 %
Neutro Abs: 3.2 10*3/uL (ref 1.7–7.7)
Neutrophils Relative %: 63 %
Platelets: 217 10*3/uL (ref 150–400)
RBC: 3.78 MIL/uL — ABNORMAL LOW (ref 3.87–5.11)
RDW: 13.3 % (ref 11.5–15.5)
WBC: 5.1 10*3/uL (ref 4.0–10.5)

## 2016-12-05 LAB — GLUCOSE, CAPILLARY
GLUCOSE-CAPILLARY: 122 mg/dL — AB (ref 65–99)
GLUCOSE-CAPILLARY: 118 mg/dL — AB (ref 65–99)
GLUCOSE-CAPILLARY: 97 mg/dL (ref 65–99)
Glucose-Capillary: 103 mg/dL — ABNORMAL HIGH (ref 65–99)
Glucose-Capillary: 121 mg/dL — ABNORMAL HIGH (ref 65–99)
Glucose-Capillary: 125 mg/dL — ABNORMAL HIGH (ref 65–99)
Glucose-Capillary: 71 mg/dL (ref 65–99)

## 2016-12-05 LAB — COMPREHENSIVE METABOLIC PANEL
ALT: 9 U/L — AB (ref 14–54)
AST: 13 U/L — AB (ref 15–41)
Albumin: 3.9 g/dL (ref 3.5–5.0)
Alkaline Phosphatase: 85 U/L (ref 38–126)
Anion gap: 6 (ref 5–15)
BILIRUBIN TOTAL: 0.5 mg/dL (ref 0.3–1.2)
BUN: 5 mg/dL — AB (ref 6–20)
CO2: 20 mmol/L — ABNORMAL LOW (ref 22–32)
Calcium: 9 mg/dL (ref 8.9–10.3)
Chloride: 112 mmol/L — ABNORMAL HIGH (ref 101–111)
Creatinine, Ser: 0.62 mg/dL (ref 0.44–1.00)
GFR calc Af Amer: >60 mL/min (ref >60)
GFR calc non Af Amer: >60 mL/min (ref >60)
GLUCOSE: 126 mg/dL — AB (ref 65–99)
POTASSIUM: 3.7 mmol/L (ref 3.5–5.1)
Sodium: 138 mmol/L (ref 135–145)
TOTAL PROTEIN: 6.1 g/dL — AB (ref 6.5–8.1)

## 2016-12-05 MED ORDER — TRAMADOL HCL 50 MG PO TABS
50.0000 mg | ORAL_TABLET | Freq: Two times a day (BID) | ORAL | Status: DC
  Administered 2016-12-05 – 2016-12-07 (×5): 50 mg via ORAL
  Filled 2016-12-05 (×5): qty 1

## 2016-12-05 MED ORDER — LOSARTAN POTASSIUM 50 MG PO TABS
50.0000 mg | ORAL_TABLET | Freq: Every day | ORAL | Status: DC
  Administered 2016-12-06 – 2016-12-07 (×2): 50 mg via ORAL
  Filled 2016-12-05 (×2): qty 1

## 2016-12-05 MED ORDER — CARVEDILOL 3.125 MG PO TABS
6.2500 mg | ORAL_TABLET | Freq: Two times a day (BID) | ORAL | Status: DC
Start: 2016-12-06 — End: 2016-12-07
  Administered 2016-12-06 – 2016-12-07 (×2): 6.25 mg via ORAL
  Filled 2016-12-05 (×3): qty 2

## 2016-12-05 MED ORDER — MILK AND MOLASSES ENEMA
1.0000 | Freq: Once | RECTAL | Status: AC
  Administered 2016-12-05: 250 mL via RECTAL

## 2016-12-05 NOTE — Care Management Note (Signed)
Case Management Note  Patient Details  Name: Christine Bolton MRN: [id number] Date of Birth: 05-Nov-1945  Subjective/Objective:      admitted with SBO. Pt is from home, lives alone, has son and mother who live int he same appartment building as her. She is ind with ADL's, drives, has PCP, and insurance with drug coverage. She reports that her insurance does not cover Protonix and that it is very expensive for her. She has tried other drug options that are covered but they do not work as well for her. .            Action/Plan: CM will give [facility] card as resource for cost of protonix. Pt plans to return home with self care. No other CM needs noted at this time.   Expected Discharge Date:     12/07/2016             Expected Discharge Plan:  Home/Self Care  In-House Referral:  NA  Discharge planning Services  CM Consult  Post Acute Care Choice:  NA Choice offered to:  NA  Status of Service:  Completed, signed off  Sherald Barge, RN 12/05/2016, 8:55 AM

## 2016-12-05 NOTE — Progress Notes (Signed)
Pt placed on [facility] CPAP. CPAP plugged into red outlet. 2L O2 in line. Pt tolerating well

## 2016-12-05 NOTE — Progress Notes (Signed)
[facility] Progress Note     Subjective: Passing flatus and no nausea. Xray with contrast in the colon but has not moved past where it is initially.  She is otherwise doing well.   Objective: Vital signs in last 24 hours: Temp:  [97.9 F (36.6 C)-98.7 F (37.1 C)] 97.9 F (36.6 C) (11/29 0457) Pulse Rate:  [64-72] 69 (11/29 0457) Resp:  [18-20] 18 (11/29 0457) BP: (135-179)/(57-63) 147/63 (11/29 0457) SpO2:  [98 %-100 %] 100 % (11/29 0457) Last BM Date: 12/03/16  Intake/Output from previous day: No intake/output data recorded. Intake/Output this shift: No intake/output data recorded.  General appearance: alert, cooperative and no distress Resp: normal work breathing GI: soft, non-tender; bowel sounds normal; no masses,  no organomegaly Extremities: extremities normal, atraumatic, no cyanosis or edema  Lab Results:  Recent Labs    12/04/16 0559 12/05/16 0542  WBC 6.3 5.1  HGB 10.1* 9.6*  HCT 34.2* 32.1*  PLT 213 217   BMET Recent Labs    12/04/16 0559 12/05/16 0542  NA 141 138  K 4.0 3.7  CL 109 112*  CO2 25 20*  GLUCOSE 113* 126*  BUN 8 5*  CREATININE 0.80 0.62  CALCIUM 9.2 9.0   PT/INR No results for input(s): LABPROT, INR in the last 72 hours.  Studies/Results: Dg Abd 1 View  Result Date: 12/05/2016 CLINICAL DATA:  71 year old female with history of small bowel obstruction EXAM: ABDOMEN - 1 VIEW COMPARISON:  CT 12/03/2016, plain film 12/04/2016 FINDINGS: Gas within small bowel and colon, with interval improvement in degree of small bowel distention. No significant progression of retained enteric contrast within the proximal colon. Surgical changes of the lumbar spine. IMPRESSION: Interval decrease degree of gaseous distention of small bowel, suggests resolving ileus/small bowel obstruction, however, there has been no further progression of retained enteric contrast within the proximal colon. Electronically Signed   By: Corrie Mckusick  D.O.   On: 12/05/2016 08:36   Ct Abdomen Pelvis W Contrast  Result Date: 12/03/2016 CLINICAL DATA:  Generalized abdominal pain with burning for 2 weeks, history of diabetes mellitus, CHF, COPD, hypertension EXAM: CT ABDOMEN AND PELVIS WITH CONTRAST TECHNIQUE: Multidetector CT imaging of the abdomen and pelvis was performed using the standard protocol following bolus administration of intravenous contrast. Sagittal and coronal MPR images reconstructed from axial data set. CONTRAST:  14mL ISOVUE-300 IOPAMIDOL (ISOVUE-300) INJECTION 61% IV. Dilute oral contrast. COMPARISON:  None FINDINGS: Lower chest: Lung bases clear Hepatobiliary: Gallbladder and liver normal appearance Pancreas: Normal appearance Spleen: Normal appearance Adrenals/Urinary Tract: BILATERAL renal cysts largest upper pole LEFT kidney 3.6 x 3.3 cm image 20. Scattered renal vascular calcifications bilaterally. No hydronephrosis or hydroureter. Bladder decompressed. Stomach/Bowel: Low-lying cecum in pelvis. Normal appendix anterior mid sacrum. Few dilated small bowel loops in the LEFT mid abdomen with decompressed distal small bowel loops consistent with small bowel obstruction. Transition from dilated to nondilated small bowel occurs in the upper LEFT pelvis best appreciated coronal image 58 where mild wall thickening is seen. This is nonspecific, could be due to inflammatory process, tumor or scarring. Colon decompressed and unremarkable. No definite gastric abnormalities the proximal stomach is collapsed and poorly assessed. Vascular/Lymphatic: Atherosclerotic calcifications aorta and iliac arteries without aneurysm. Few coronary artery calcifications incidentally noted. No definite adenopathy. Reproductive: Uterus surgically absent. Normal sized ovaries bilaterally. Other: Small amount of free pelvic fluid. Small umbilical hernia containing fat. No definite free air. Musculoskeletal: Prior lumbar fusion L4-S1. IMPRESSION: Mid small bowel  obstruction with  transition zone in the upper LEFT pelvis where wall bowel wall thickening is identified, question due to inflammatory process, scarring, or tumor. BILATERAL renal cysts. Small umbilical hernia containing fat. Scattered atherosclerotic calcifications as above. Aortic Atherosclerosis (ICD10-I70.0). Electronically Signed   By: Lavonia Dana M.D.   On: 12/03/2016 17:27   Dg Abd Portable 2v  Result Date: 12/04/2016 CLINICAL DATA:  Small bowel loops EXAM: PORTABLE ABDOMEN - 2 VIEW COMPARISON:  12/03/2016 FINDINGS: There is no free intraperitoneal gas on the upright view. Contrast has traversed into the colon. Distended loops of small and large bowel persist. Lumbar stabilization hardware remains in place. IMPRESSION: There is continued distention of both small and large bowel, however contrast has traversed into the colon excluding complete obstruction of small bowel. Findings may reflect ileus or partial small bowel obstruction. Electronically Signed   By: Marybelle Killings M.D.   On: 12/04/2016 10:37     Assessment/Plan: Ms. Christine Bolton is a 71 yo with a SBO with a focal area of thickened small bowel causing the obstruction for unknown etiologies concerning for cancer versus inflammation. The contrast has moved past the point of thickening and is in the colon. She is passing gas and her SB is less dilated.  -Clear today -Enema today -Repeat Xray in the AM -Can have po meds     LOS: 1 day    Virl Cagey 12/05/2016

## 2016-12-05 NOTE — Progress Notes (Signed)
PROGRESS NOTE    Christine Bolton  EXN:[id number] DOB: December 10, 1945 DOA: 12/03/2016 PCP: Keane Police, MD    Brief Narrative:  70 year old female with history of iron deficiency anemia autoimmune hemolytic anemia with recent extensive workup (EGD/colonoscopy/capsule study and enteroscopy), COPD, type 2 diabetes mellitus, GERD, asthma, history of CHF, coronary artery disease, sleep apnea on BiPAP at home and history of laparotomy for back surgery sent to the ED from GI clinic after she was found to have partial small bowel obstruction on CT of the abdomen. Patient reported 2 weeks history of diffuse abdominal discomfort and indigestion. One week back she noticed that she was unable to hold anything she ate and started having diarrhea followed by loose stools. Denied any fever but had fatigue, burning epigastric pain and chills.  In the ED vitals were stable except for elevated blood pressure. Blood work showed hemoglobin of 10.9, potassium of 3.1 with normal lipase and LFTs. Admitted for further management.     Assessment & Plan:   Principal Problem:   SBO (small bowel obstruction) (HCC) Active Problems:   Anemia   Diarrhea   Sleep apnea   Hypercholesterolemia   GERD (gastroesophageal reflux disease)   HTN (hypertension)   COPD (chronic obstructive pulmonary disease) (HCC)   Diabetes ([place])   Coronary artery disease   Small bowel obstruction (HCC)   SBO (small bowel obstruction) ([place]) As seen on CT abdomen from 11/27. Follow-up abdominal x-ray shows persistent distention of small and large bowel with contrast traversing into the colon suggestive of partial small bowel obstruction. Keep nothing by mouth. No active abdominal distention, nausea or vomiting. 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   Hypokalemia IV fluids with potassium Repeat BMP in the a.m.     GERD (gastroesophageal reflux disease) Continue PPI    COPD (chronic obstructive pulmonary disease) (HCC) Stable. Continue when necessary nebs and inhaler.  Coronary artery disease Restart patient's home aspirin, statin, Coreg, lisinopril  Diabetes mellitus type II 10 you to hold metformin Patient on sliding slight scale coverage  Essential hypertension Restart home blood pressure medications as patient is allowed to take p.o. medications     DVT prophylaxis: Lovenox Code Status: Full code Family Communication: Daughter is bedside Disposition Plan: Pending improvement in small bowel obstruction   Consultants:   General surgery  Procedures:   None  Antimicrobials:   None   Subjective: Patient seen and evaluated.  She states that she has been passing flatus all morning.  She is anxious to try clear liquid diet and get some of her home medications.  She did denies any abdominal pain.  Objective: Vitals:   12/04/16 2119 12/05/16 0100 12/05/16 0457 12/05/16 1604  BP: (!) 170/63  (!) 147/63 (!) 176/69  Pulse: 66  69 71  Resp: 20  18 18   Temp: 98.7 F (37.1 C)  97.9 F (36.6 C) 98.7 F (37.1 C)  TempSrc: Oral  Oral   SpO2: 100% 98% 100% 100%  Weight:      Height:       No intake or output data in the 24 hours ending 12/05/16 1637 Filed Weights   12/03/16 2003 12/04/16 0233  Weight: 85.7 kg (189 lb) 83.9 kg (185 lb)    Examination:  General exam: Appears calm  and comfortable  Respiratory system: Clear to auscultation. Respiratory effort normal. Cardiovascular system: S1 & S2 heard, RRR. No JVD, murmurs, rubs, gallops or clicks. No pedal edema. Gastrointestinal system: Abdomen is nondistended, soft and nontender. No organomegaly or masses felt. Normal bowel sounds heard. Central nervous system: Alert and  oriented. No focal neurological deficits. Extremities: Symmetric 5 x 5 power. Skin: No rashes, lesions or ulcers Psychiatry: Judgement and insight appear normal. Mood & affect appropriate.     Data Reviewed: I have personally reviewed following labs and imaging studies  CBC: Recent Labs  Lab 12/03/16 1348 12/03/16 2240 12/04/16 0559 12/05/16 0542  WBC 5.5 6.8 6.3 5.1  NEUTROABS 3.1 4.3 3.9 3.2  HGB 9.9* 10.9* 10.1* 9.6*  HCT 33.7* 36.7 34.2* 32.1*  MCV 85.3 85.0 85.7 84.9  PLT 234 252 213 580   Basic Metabolic Panel: Recent Labs  Lab 12/03/16 1348 12/03/16 2240 12/04/16 0559 12/05/16 0542  NA 142 138 141 138  K 4.0 3.1* 4.0 3.7  CL 107 102 109 112*  CO2 27 28 25  20*  GLUCOSE 109* 101* 113* 126*  BUN 14 11 8  5*  CREATININE 0.75 0.78 0.80 0.62  CALCIUM 10.0 10.0 9.2 9.0  MG  --  1.8  --   --    GFR: Estimated Creatinine Clearance: 67.1 mL/min (by C-G formula based on SCr of 0.62 mg/dL). Liver Function Tests: Recent Labs  Lab 12/03/16 1348 12/03/16 2240 12/04/16 0559 12/05/16 0542  AST 16 18 15  13*  ALT 10* 10* 10* 9*  ALKPHOS 95 100 95 85  BILITOT 0.7 0.7 0.6 0.5  PROT 7.4 7.8 6.7 6.1*  ALBUMIN 4.6 4.9 4.2 3.9   Recent Labs  Lab 12/03/16 2240  LIPASE 28   No results for input(s): AMMONIA in the last 168 hours. Coagulation Profile: No results for input(s): INR, PROTIME in the last 168 hours. Cardiac Enzymes: No results for input(s): CKTOTAL, CKMB, CKMBINDEX, TROPONINI in the last 168 hours. BNP (last 3 results) No results for input(s): PROBNP in the last 8760 hours. HbA1C: No results for input(s): HGBA1C in the last 72 hours. CBG: Recent Labs  Lab 12/04/16 2118 12/05/16 0019 12/05/16 0456 12/05/16 0743 12/05/16 1216  GLUCAP 100* 103* 122* 118* 125*   Lipid Profile: No results for input(s): CHOL, HDL, LDLCALC, TRIG, CHOLHDL, LDLDIRECT in the last 72 hours. Thyroid Function Tests: No results for input(s): TSH, T4TOTAL, FREET4, T3FREE,  THYROIDAB in the last 72 hours. Anemia Panel: No results for input(s): VITAMINB12, FOLATE, FERRITIN, TIBC, IRON, RETICCTPCT in the last 72 hours. Sepsis Labs: No results for input(s): PROCALCITON, LATICACIDVEN in the last 168 hours.  No results found for this or any previous visit (from the past 240 hour(s)).       Radiology Studies: Dg Abd 1 View  Result Date: 12/05/2016 CLINICAL DATA:  71 year old female with history of small bowel obstruction EXAM: ABDOMEN - 1 VIEW COMPARISON:  CT 12/03/2016, plain film 12/04/2016 FINDINGS: Gas within small bowel and colon, with interval improvement in degree of small bowel distention. No significant progression of retained enteric contrast within the proximal colon. Surgical changes of the lumbar spine. IMPRESSION: Interval decrease degree of gaseous distention of small bowel, suggests resolving ileus/small bowel obstruction, however, there has been no further progression of retained enteric contrast within the proximal colon. Electronically Signed   By: Corrie Mckusick D.O.   On: 12/05/2016 08:36   Ct Abdomen Pelvis W Contrast  Result Date: 12/03/2016 CLINICAL DATA:  Generalized abdominal  pain with burning for 2 weeks, history of diabetes mellitus, CHF, COPD, hypertension EXAM: CT ABDOMEN AND PELVIS WITH CONTRAST TECHNIQUE: Multidetector CT imaging of the abdomen and pelvis was performed using the standard protocol following bolus administration of intravenous contrast. Sagittal and coronal MPR images reconstructed from axial data set. CONTRAST:  121mL ISOVUE-300 IOPAMIDOL (ISOVUE-300) INJECTION 61% IV. Dilute oral contrast. COMPARISON:  None FINDINGS: Lower chest: Lung bases clear Hepatobiliary: Gallbladder and liver normal appearance Pancreas: Normal appearance Spleen: Normal appearance Adrenals/Urinary Tract: BILATERAL renal cysts largest upper pole LEFT kidney 3.6 x 3.3 cm image 20. Scattered renal vascular calcifications bilaterally. No hydronephrosis or  hydroureter. Bladder decompressed. Stomach/Bowel: Low-lying cecum in pelvis. Normal appendix anterior mid sacrum. Few dilated small bowel loops in the LEFT mid abdomen with decompressed distal small bowel loops consistent with small bowel obstruction. Transition from dilated to nondilated small bowel occurs in the upper LEFT pelvis best appreciated coronal image 58 where mild wall thickening is seen. This is nonspecific, could be due to inflammatory process, tumor or scarring. Colon decompressed and unremarkable. No definite gastric abnormalities the proximal stomach is collapsed and poorly assessed. Vascular/Lymphatic: Atherosclerotic calcifications aorta and iliac arteries without aneurysm. Few coronary artery calcifications incidentally noted. No definite adenopathy. Reproductive: Uterus surgically absent. Normal sized ovaries bilaterally. Other: Small amount of free pelvic fluid. Small umbilical hernia containing fat. No definite free air. Musculoskeletal: Prior lumbar fusion L4-S1. IMPRESSION: Mid small bowel obstruction with transition zone in the upper LEFT pelvis where wall bowel wall thickening is identified, question due to inflammatory process, scarring, or tumor. BILATERAL renal cysts. Small umbilical hernia containing fat. Scattered atherosclerotic calcifications as above. Aortic Atherosclerosis (ICD10-I70.0). Electronically Signed   By: Lavonia Dana M.D.   On: 12/03/2016 17:27   Dg Abd Portable 2v  Result Date: 12/04/2016 CLINICAL DATA:  Small bowel loops EXAM: PORTABLE ABDOMEN - 2 VIEW COMPARISON:  12/03/2016 FINDINGS: There is no free intraperitoneal gas on the upright view. Contrast has traversed into the colon. Distended loops of small and large bowel persist. Lumbar stabilization hardware remains in place. IMPRESSION: There is continued distention of both small and large bowel, however contrast has traversed into the colon excluding complete obstruction of small bowel. Findings may reflect  ileus or partial small bowel obstruction. Electronically Signed   By: Marybelle Killings M.D.   On: 12/04/2016 10:37        Scheduled Meds: . hydrOXYzine  25 mg Oral QHS  . Influenza vac split quadrivalent PF  0.5 mL Intramuscular Tomorrow-1000  . insulin aspart  0-9 Units Subcutaneous Q4H  . olopatadine  1 drop Both Eyes QHS  . pantoprazole (PROTONIX) IV  40 mg Intravenous Q24H  . tiZANidine  8 mg Oral QHS  . topiramate  50 mg Oral QHS  . traMADol  50 mg Oral Q12H  . traZODone  50 mg Oral QHS   Continuous Infusions: . dextrose 5 % and 0.9 % NaCl with KCl 20 mEq/L 100 mL/hr at 12/05/16 0503     LOS: 1 day    Time spent: 30 minutes    Loretha Stapler, MD [facility] Pager [phone]  If 7PM-7AM, please contact night-coverage [email or web address] Password [facility] 12/05/2016, 4:37 PM

## 2016-12-05 NOTE — Progress Notes (Addendum)
Checked on pt to put on CPAP pt not ready yet states "she's in pain and will put on herself because she has one at home." RT will go back to check to see if pt ready to put machine on in 30 - 45 minutes.

## 2016-12-05 NOTE — Plan of Care (Signed)
Pt is progressing 

## 2016-12-06 ENCOUNTER — Inpatient Hospital Stay (HOSPITAL_COMMUNITY): Payer: Medicare HMO

## 2016-12-06 LAB — GLUCOSE, CAPILLARY
GLUCOSE-CAPILLARY: 100 mg/dL — AB (ref 65–99)
Glucose-Capillary: 112 mg/dL — ABNORMAL HIGH (ref 65–99)
Glucose-Capillary: 114 mg/dL — ABNORMAL HIGH (ref 65–99)
Glucose-Capillary: 130 mg/dL — ABNORMAL HIGH (ref 65–99)
Glucose-Capillary: 71 mg/dL (ref 65–99)
Glucose-Capillary: 88 mg/dL (ref 65–99)
Glucose-Capillary: 97 mg/dL (ref 65–99)

## 2016-12-06 LAB — CBC WITH DIFFERENTIAL/PLATELET
BASOS ABS: 0 10*3/uL (ref 0.0–0.1)
Basophils Relative: 0 %
EOS PCT: 8 %
Eosinophils Absolute: 0.4 10*3/uL (ref 0.0–0.7)
HEMATOCRIT: 30.8 % — AB (ref 36.0–46.0)
HEMOGLOBIN: 9.2 g/dL — AB (ref 12.0–15.0)
LYMPHS ABS: 1.1 10*3/uL (ref 0.7–4.0)
LYMPHS PCT: 24 %
MCH: 25.3 pg — ABNORMAL LOW (ref 26.0–34.0)
MCHC: 29.9 g/dL — ABNORMAL LOW (ref 30.0–36.0)
MCV: 84.6 fL (ref 78.0–100.0)
Monocytes Absolute: 0.6 10*3/uL (ref 0.1–1.0)
Monocytes Relative: 13 %
NEUTROS ABS: 2.5 10*3/uL (ref 1.7–7.7)
NEUTROS PCT: 55 %
PLATELETS: 210 10*3/uL (ref 150–400)
RBC: 3.64 MIL/uL — AB (ref 3.87–5.11)
RDW: 13.4 % (ref 11.5–15.5)
WBC: 4.5 10*3/uL (ref 4.0–10.5)

## 2016-12-06 LAB — BASIC METABOLIC PANEL
ANION GAP: 7 (ref 5–15)
BUN: 5 mg/dL — ABNORMAL LOW (ref 6–20)
CHLORIDE: 108 mmol/L (ref 101–111)
CO2: 22 mmol/L (ref 22–32)
Calcium: 9.2 mg/dL (ref 8.9–10.3)
Creatinine, Ser: 0.78 mg/dL (ref 0.44–1.00)
GFR calc non Af Amer: >60 mL/min (ref >60)
Glucose, Bld: 84 mg/dL (ref 65–99)
Potassium: 4.1 mmol/L (ref 3.5–5.1)
Sodium: 137 mmol/L (ref 135–145)

## 2016-12-06 MED ORDER — DOCUSATE SODIUM 100 MG PO CAPS
100.0000 mg | ORAL_CAPSULE | Freq: Two times a day (BID) | ORAL | Status: DC
  Administered 2016-12-06 – 2016-12-07 (×3): 100 mg via ORAL
  Filled 2016-12-06 (×4): qty 1

## 2016-12-06 NOTE — Progress Notes (Signed)
[facility] Progress Note     Subjective: Had 2 BMs yesterday. One after enema and later in the evening spontaneously. Tolerating clears and no nausea.   Objective: Vital signs in last 24 hours: Temp:  [97.6 F (36.4 C)-98.9 F (37.2 C)] 97.6 F (36.4 C) (11/30 0543) Pulse Rate:  [67-76] 67 (11/30 0804) Resp:  [18] 18 (11/30 0543) BP: (99-176)/(52-69) 124/67 (11/30 0804) SpO2:  [100 %] 100 % (11/30 0543) Last BM Date: 12/05/16  Intake/Output from previous day: 11/29 0701 - 11/30 0700 In: 1528.3 [P.O.:360; I.V.:1168.3] Out: 2 [Stool:2] Intake/Output this shift: No intake/output data recorded.  General appearance: alert, cooperative and no distress Resp: normal work breathing GI: soft, nondistended, nontender  Lab Results:  Recent Labs    12/05/16 0542 12/06/16 0422  WBC 5.1 4.5  HGB 9.6* 9.2*  HCT 32.1* 30.8*  PLT 217 210   BMET Recent Labs    12/04/16 0559 12/05/16 0542  NA 141 138  K 4.0 3.7  CL 109 112*  CO2 25 20*  GLUCOSE 113* 126*  BUN 8 5*  CREATININE 0.80 0.62  CALCIUM 9.2 9.0   PT/INR No results for input(s): LABPROT, INR in the last 72 hours.  Studies/Results: Dg Abd 1 View  Result Date: 12/05/2016 CLINICAL DATA:  71 year old female with history of small bowel obstruction EXAM: ABDOMEN - 1 VIEW COMPARISON:  CT 12/03/2016, plain film 12/04/2016 FINDINGS: Gas within small bowel and colon, with interval improvement in degree of small bowel distention. No significant progression of retained enteric contrast within the proximal colon. Surgical changes of the lumbar spine. IMPRESSION: Interval decrease degree of gaseous distention of small bowel, suggests resolving ileus/small bowel obstruction, however, there has been no further progression of retained enteric contrast within the proximal colon. Electronically Signed   By: Corrie Mckusick D.O.   On: 12/05/2016 08:36   Dg Abd Portable 1v  Result Date: 12/06/2016 CLINICAL DATA:   Shortness of breath, constipation EXAM: PORTABLE ABDOMEN - 1 VIEW COMPARISON:  12/05/2016 FINDINGS: Contrast noted within nondistended colon. Mild gaseous distention of bowel, predominantly large bowel suggesting mild ileus. No free air organomegaly. Postoperative changes in the lower lumbar spine. IMPRESSION: Mild gaseous distention of bowel, predominantly colon, likely mild ileus. Oral contrast material noted within the transverse colon. Electronically Signed   By: Rolm Baptise M.D.   On: 12/06/2016 09:45    Anti-infectives: Anti-infectives (From admission, onward)   None      Assessment/Plan: Christine Bolton is a 71 yo with a SBO with a focal area of thickening of the small bowel causing a partial obstruction of unknown etiology; cancer versus inflammatory. Has had an extensive GI workup in the last several months with EGD, colonoscopy, capsule studies, and enterostomy with AVM argon beam cautery for anemia.  She is now passing gas and BMs.  -Advanced to soft diet -Colace added -Will assess in the AM and can likely go home with plan for CT enterography in the upcoming weeks    LOS: 2 days    Virl Cagey 12/06/2016

## 2016-12-06 NOTE — Progress Notes (Signed)
PROGRESS NOTE    Christine Bolton  EQA:[id number] DOB: September 02, 1945 DOA: 12/03/2016 PCP: Keane Police, MD    Brief Narrative:  71 year old female with history of iron deficiency anemia autoimmune hemolytic anemia with recent extensive workup (EGD/colonoscopy/capsule study and enteroscopy), COPD, type 2 diabetes mellitus, GERD, asthma, history of CHF, coronary artery disease, sleep apnea on BiPAP at home and history of laparotomy for back surgery sent to the ED from GI clinic after she was found to have partial small bowel obstruction on CT of the abdomen. Patient reported 2 weeks history of diffuse abdominal discomfort and indigestion. One week back she noticed that she was unable to hold anything she ate and started having diarrhea followed by loose stools. Denied any fever but had fatigue, burning epigastric pain and chills.  In the ED vitals were stable except for elevated blood pressure. Blood work showed hemoglobin of 10.9, potassium of 3.1 with normal lipase and LFTs. Admitted for further management.     Assessment & Plan:   Principal Problem:   SBO (small bowel obstruction) (HCC) Active Problems:   Anemia   Diarrhea   Sleep apnea   Hypercholesterolemia   GERD (gastroesophageal reflux disease)   HTN (hypertension)   COPD (chronic obstructive pulmonary disease) (HCC)   Diabetes ([place])   Coronary artery disease   Small bowel obstruction (HCC)   SBO (small bowel obstruction) ([place]) As seen on CT abdomen from 11/27. Follow-up abdominal x-ray shows persistent distention of small and large bowel with contrast traversing into the colon suggestive of partial small bowel obstruction. Keep nothing by mouth. No active abdominal distention, nausea or vomiting. 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   Hypokalemia BMP pending     GERD (gastroesophageal reflux disease) Continue PPI    COPD (chronic obstructive pulmonary disease) (HCC) Stable. Continue when necessary nebs and inhaler.  Coronary artery disease Restart patient's home aspirin, statin, Coreg, lisinopril  Diabetes mellitus type II Holding metformin Patient on sliding slight scale coverage  Essential hypertension Home medications restarted including Coreg and lisinopril     DVT prophylaxis: Lovenox Code Status: Full code Family Communication: No family bedside  disposition Plan: If patient continues to improve, have flatus and bowel movements can consider discharge tomorrow   Consultants:   General surgery  Procedures:   None  Antimicrobials:   None   Subjective: Patient in good spirits this morning.  States that she had 2 bowel movements last night.  Denies any abdominal pain.  Is anxious to try a soft diet and hopeful to be able to go home soon.  Objective: Vitals:   12/05/16 2023 12/06/16 0543 12/06/16 0804 12/06/16 1246  BP: (!) 155/65 (!) 99/52 124/67 (!) 133/51  Pulse: 76 73 67 71  Resp: 18 18  18   Temp: 98.9 F (37.2 C) 97.6 F (36.4 C)  98.8 F (37.1 C)  TempSrc: Oral Oral  Oral  SpO2: 100% 100%  100%  Weight:      Height:        Intake/Output Summary (Last 24 hours) at 12/06/2016 1527 Last data filed at 12/06/2016 1300 Gross per 24 hour  Intake 2328.33 ml  Output -  Net 2328.33 ml   Filed Weights   12/03/16 2003 12/04/16 0233  Weight: 85.7 kg (189 lb) 83.9 kg (185 lb)    Examination:  Gen: not in distress HEENT: Pallor present, dry oral mucosa, supple neck Chest: clear b/l, no added sounds CVS: N S1&S2, no murmurs, rubs or gallop GI: soft, NT, ND,  bowel sounds in all quadrants Musculoskeletal: warm, no edema    Data  Reviewed: I have personally reviewed following labs and imaging studies  CBC: Recent Labs  Lab 12/03/16 1348 12/03/16 2240 12/04/16 0559 12/05/16 0542 12/06/16 0422  WBC 5.5 6.8 6.3 5.1 4.5  NEUTROABS 3.1 4.3 3.9 3.2 2.5  HGB 9.9* 10.9* 10.1* 9.6* 9.2*  HCT 33.7* 36.7 34.2* 32.1* 30.8*  MCV 85.3 85.0 85.7 84.9 84.6  PLT 234 252 213 217 458   Basic Metabolic Panel: Recent Labs  Lab 12/03/16 1348 12/03/16 2240 12/04/16 0559 12/05/16 0542  NA 142 138 141 138  K 4.0 3.1* 4.0 3.7  CL 107 102 109 112*  CO2 27 28 25  20*  GLUCOSE 109* 101* 113* 126*  BUN 14 11 8  5*  CREATININE 0.75 0.78 0.80 0.62  CALCIUM 10.0 10.0 9.2 9.0  MG  --  1.8  --   --    GFR: Estimated Creatinine Clearance: 67.1 mL/min (by C-G formula based on SCr of 0.62 mg/dL). Liver Function Tests: Recent Labs  Lab 12/03/16 1348 12/03/16 2240 12/04/16 0559 12/05/16 0542  AST 16 18 15  13*  ALT 10* 10* 10* 9*  ALKPHOS 95 100 95 85  BILITOT 0.7 0.7 0.6 0.5  PROT 7.4 7.8 6.7 6.1*  ALBUMIN 4.6 4.9 4.2 3.9   Recent Labs  Lab 12/03/16 2240  LIPASE 28   No results for input(s): AMMONIA in the last 168 hours. Coagulation Profile: No results for input(s): INR, PROTIME in the last 168 hours. Cardiac Enzymes: No results for input(s): CKTOTAL, CKMB, CKMBINDEX, TROPONINI in the last 168 hours. BNP (last 3 results) No results for input(s): PROBNP in the last 8760 hours. HbA1C: No results for input(s): HGBA1C in the last 72 hours. CBG: Recent Labs  Lab 12/05/16 2021 12/06/16 0045 12/06/16 0542 12/06/16 0757 12/06/16 1122  GLUCAP 97 130* 100* 114* 88   Lipid Profile: No results for input(s): CHOL, HDL, LDLCALC, TRIG, CHOLHDL, LDLDIRECT in the last 72 hours. Thyroid Function Tests: No results for input(s): TSH, T4TOTAL, FREET4, T3FREE, THYROIDAB in the last 72 hours. Anemia Panel: No results for input(s): VITAMINB12, FOLATE, FERRITIN, TIBC, IRON, RETICCTPCT in the last 72 hours. Sepsis Labs: No  results for input(s): PROCALCITON, LATICACIDVEN in the last 168 hours.  No results found for this or any previous visit (from the past 240 hour(s)).       Radiology Studies: Dg Abd 1 View  Result Date: 12/05/2016 CLINICAL DATA:  71 year old female with history of small bowel obstruction EXAM: ABDOMEN - 1 VIEW COMPARISON:  CT 12/03/2016, plain film 12/04/2016 FINDINGS: Gas within small bowel and colon, with interval improvement in degree of small bowel distention. No significant progression of retained enteric contrast within the proximal colon. Surgical changes of the lumbar spine. IMPRESSION: Interval decrease degree of gaseous distention of small bowel, suggests resolving ileus/small bowel obstruction, however, there has been no further progression of retained enteric contrast within the proximal colon. Electronically Signed   By: Corrie Mckusick D.O.   On: 12/05/2016 08:36   Dg Abd Portable 1v  Result Date: 12/06/2016 CLINICAL DATA:  Shortness of breath, constipation EXAM: PORTABLE ABDOMEN - 1 VIEW COMPARISON:  12/05/2016 FINDINGS: Contrast noted within nondistended colon.  Mild gaseous distention of bowel, predominantly large bowel suggesting mild ileus. No free air organomegaly. Postoperative changes in the lower lumbar spine. IMPRESSION: Mild gaseous distention of bowel, predominantly colon, likely mild ileus. Oral contrast material noted within the transverse colon. Electronically Signed   By: Rolm Baptise M.D.   On: 12/06/2016 09:45        Scheduled Meds: . carvedilol  6.25 mg Oral BID WC  . docusate sodium  100 mg Oral BID  . hydrOXYzine  25 mg Oral QHS  . Influenza vac split quadrivalent PF  0.5 mL Intramuscular Tomorrow-1000  . insulin aspart  0-9 Units Subcutaneous Q4H  . losartan  50 mg Oral Daily  . olopatadine  1 drop Both Eyes QHS  . pantoprazole (PROTONIX) IV  40 mg Intravenous Q24H  . tiZANidine  8 mg Oral QHS  . topiramate  50 mg Oral QHS  . traMADol  50 mg Oral  Q12H  . traZODone  50 mg Oral QHS   Continuous Infusions:    LOS: 2 days    Time spent: 25 minutes    Loretha Stapler, MD [facility] Pager [phone]  If 7PM-7AM, please contact night-coverage [email or web address] Password [email or web address] 12/06/2016, 3:27 PM

## 2016-12-06 NOTE — Care Management Important Message (Signed)
Important Message  Patient Details  Name: Christine Bolton MRN: [id number] Date of Birth: [birth year]   Medicare Important Message Given:  Yes    Siham Bucaro, Chauncey Reading, RN 12/06/2016, 12:03 PM

## 2016-12-07 DIAGNOSIS — D509 Iron deficiency anemia, unspecified: Secondary | ICD-10-CM

## 2016-12-07 DIAGNOSIS — E7800 Pure hypercholesterolemia, unspecified: Secondary | ICD-10-CM

## 2016-12-07 DIAGNOSIS — K219 Gastro-esophageal reflux disease without esophagitis: Secondary | ICD-10-CM

## 2016-12-07 LAB — CBC WITH DIFFERENTIAL/PLATELET
BASOS ABS: 0 10*3/uL (ref 0.0–0.1)
BASOS PCT: 1 %
EOS PCT: 10 %
Eosinophils Absolute: 0.4 10*3/uL (ref 0.0–0.7)
HCT: 30.5 % — ABNORMAL LOW (ref 36.0–46.0)
Hemoglobin: 8.9 g/dL — ABNORMAL LOW (ref 12.0–15.0)
LYMPHS PCT: 26 %
Lymphs Abs: 1 10*3/uL (ref 0.7–4.0)
MCH: 24.9 pg — ABNORMAL LOW (ref 26.0–34.0)
MCHC: 29.2 g/dL — ABNORMAL LOW (ref 30.0–36.0)
MCV: 85.2 fL (ref 78.0–100.0)
Monocytes Absolute: 0.3 10*3/uL (ref 0.1–1.0)
Monocytes Relative: 9 %
Neutro Abs: 2.1 10*3/uL (ref 1.7–7.7)
Neutrophils Relative %: 54 %
PLATELETS: 199 10*3/uL (ref 150–400)
RBC: 3.58 MIL/uL — AB (ref 3.87–5.11)
RDW: 13.4 % (ref 11.5–15.5)
WBC: 3.9 10*3/uL — AB (ref 4.0–10.5)

## 2016-12-07 LAB — GLUCOSE, CAPILLARY
GLUCOSE-CAPILLARY: 89 mg/dL (ref 65–99)
GLUCOSE-CAPILLARY: 82 mg/dL (ref 65–99)
GLUCOSE-CAPILLARY: 92 mg/dL (ref 65–99)

## 2016-12-07 MED ORDER — LINACLOTIDE 145 MCG PO CAPS: 145.0000 ug | ORAL_CAPSULE | Freq: Every day | ORAL | Status: DC | PRN

## 2016-12-07 MED ORDER — LINACLOTIDE 145 MCG PO CAPS
145.0000 ug | ORAL_CAPSULE | Freq: Every day | ORAL | Status: DC
  Administered 2016-12-07: 145 ug via ORAL
  Filled 2016-12-07: qty 1

## 2016-12-07 MED ORDER — PANTOPRAZOLE SODIUM 40 MG PO TBEC: 40.0000 mg | DELAYED_RELEASE_TABLET | Freq: Every day | ORAL | 1 refills | Status: AC

## 2016-12-07 NOTE — Progress Notes (Signed)
IV removed, patient tolerated well, 2x2 gauze and paper tape applied to site, no bleeding noted, reviewed discharge information, patient verbalized understanding.

## 2016-12-07 NOTE — Discharge Summary (Signed)
Physician Discharge Summary  Christine Bolton TGG:[id number] DOB: 03-15-1945 DOA: 12/03/2016  PCP: Keane Police, MD  Admit date: 12/03/2016 Discharge date: 12/07/2016  Admitted From: Home Disposition: Home  Recommendations for Outpatient Follow-up:  1. Follow up with PCP in 1-2 weeks 2. Follow-up with general surgery in 2 weeks 3. Please obtain BMP/CBC in one week 4. Prescription for pantoprazole sent to pharmacy  Home Health: None Equipment/Devices: None  Discharge Condition: Stable CODE STATUS: Full code Diet recommendation: Heart healthy  Brief/Interim Summary: 71 year old female with history of iron deficiency anemia autoimmune hemolytic anemia with recent extensive workup (EGD/colonoscopy/capsule study and enteroscopy), COPD, type 2 diabetes mellitus, GERD, asthma, history of CHF, coronary artery disease, sleep apnea on BiPAP at home and history of laparotomy for back surgery sent to the ED from GI clinic after she was found to have partial small bowel obstruction on CT of the abdomen. Patient reported 2 weeks history of diffuse abdominal discomfort and indigestion. One week back she noticed that she was unable to hold anything she ate and started having diarrhea followed by loose stools. Denied any fever but had fatigue, burning epigastric pain and chills. In the ED vitals were stable except for elevated blood pressure. Blood work showed hemoglobin of 10.9, potassium of 3.1 with normal lipase and LFTs.  Patient was admitted for a partial small bowel obstruction.  General surgery was consulted.  She received an enema and began having bowel movements.  On day of discharge which was 12/07/2016 patient was tolerating diet and flatus.   Discharge Diagnoses:  Principal Problem:   SBO (small bowel obstruction) (HCC) Active Problems:   Anemia   Diarrhea   Sleep apnea   Hypercholesterolemia   GERD (gastroesophageal reflux disease)   HTN (hypertension)   COPD (chronic obstructive  pulmonary disease) (HCC)   Diabetes ([place])   Coronary artery disease   Small bowel obstruction [facility])    Discharge Instructions  Discharge Instructions    Call MD for:  difficulty breathing, headache or visual disturbances   Complete by:  As directed    Call MD for:  extreme fatigue   Complete by:  As directed    Call MD for:  hives   Complete by:  As directed    Call MD for:  persistant dizziness or light-headedness   Complete by:  As directed    Call MD for:  persistant nausea and vomiting   Complete by:  As directed    Call MD for:  severe uncontrolled pain   Complete by:  As directed    Call MD for:  temperature >100.4   Complete by:  As directed    Diet - low sodium heart healthy   Complete by:  As directed    Discharge instructions   Complete by:  As directed    Medications as prescribed   Increase activity slowly   Complete by:  As directed      Allergies as of 12/07/2016      Reactions   Levofloxacin Other (See Comments)   Increase heart rate      Medication List    STOP taking these medications   omeprazole 20 MG capsule Commonly known as:  PRILOSEC     TAKE these medications   Albuterol Sulfate 108 (90 Base) MCG/ACT Aepb Inhale 1 puff into the lungs 2 (two) times daily as needed (wheezing).   alendronate 70 MG tablet Commonly known as:  FOSAMAX Take 70 mg by mouth once a week. Every Tuesday  aspirin EC 81 MG tablet Take 81 mg by mouth daily.   atorvastatin 40 MG tablet Commonly known as:  LIPITOR Take 1 tablet by mouth daily.   CALTRATE 600+D PO Take 1 tablet by mouth daily.   carvedilol 25 MG tablet Commonly known as:  COREG Take 6.25 mg by mouth 2 (two) times daily with a meal.   ferrous sulfate 325 (65 FE) MG EC tablet Take 325 mg by mouth 2 (two) times daily.   folic acid 1 MG tablet Commonly known as:  FOLVITE Take 1 tablet by mouth daily.   hydrOXYzine 25 MG capsule Commonly known as:  VISTARIL Take 25 mg by mouth at  bedtime.   INCRUSE ELLIPTA 62.5 MCG/INH Aepb Generic drug:  umeclidinium bromide INHALE ONE PUFF BY MOUTH TWICE DAILY   ipratropium 0.06 % nasal spray Commonly known as:  ATROVENT INSTILL 1 SPRAY IN BOTH NOSTRILS EVERY 4 HOURS AS NEEDED   ipratropium-albuterol 0.5-2.5 (3) MG/3ML Soln Commonly known as:  DUONEB Inhale 3 mLs into the lungs at bedtime. *MAY USE DAILY AS NEEDED FOR SHORTNESS OF BREATH/WHEEZING   LINZESS 145 MCG Caps capsule Generic drug:  linaclotide Take 145 mcg by mouth daily as needed (IBS, constipation).   losartan 100 MG tablet Commonly known as:  COZAAR Take 50 mg by mouth daily.   MAGNESIUM-OXIDE 400 (241.3 Mg) MG tablet Generic drug:  magnesium oxide TAKE 1 TABLET BY MOUTH TWICE DAILY   metFORMIN 500 MG tablet Commonly known as:  GLUCOPHAGE Take 500 mg by mouth daily (with breakfast). Take with meal   nitroGLYCERIN 0.4 MG SL tablet Commonly known as:  NITROSTAT Place 0.4 mg under the tongue every 5 (five) minutes as needed for chest pain.   olopatadine 0.1 % ophthalmic solution Commonly known as:  PATANOL Place 1 drop into both eyes at bedtime.   pantoprazole 40 MG tablet Commonly known as:  PROTONIX Take 1 tablet (40 mg total) by mouth daily.   spironolactone 25 MG tablet Commonly known as:  ALDACTONE Take 1 tablet by mouth daily.   tiZANidine 4 MG tablet Commonly known as:  ZANAFLEX Take 8 mg by mouth at bedtime.   Topiramate ER 25 MG Cs24 sprinkle cap Commonly known as:  QUDEXY XR Take 2 capsules by mouth at bedtime.   traMADol 50 MG tablet Commonly known as:  ULTRAM Take 50 mg by mouth TWICE DAILY   traZODone 50 MG tablet Commonly known as:  DESYREL Take 50 mg by mouth at bedtime.      Follow-up Information    Virl Cagey, MD Follow up in 2 week(s).   Specialty:  General Surgery Contact information: [address] [place] [facility] [place] [address] [phone]        Keane Police, MD. Schedule an appointment as  soon as possible for a visit in 1 week(s).   Specialty:  Family Medicine Contact information: [address] [place] [address] [phone]          Allergies  Allergen Reactions  . Levofloxacin Other (See Comments)    Increase heart rate    Consultations:  General surgery    Procedures/Studies: Dg Abd 1 View  Result Date: 12/05/2016 CLINICAL DATA:  71 year old female with history of small bowel obstruction EXAM: ABDOMEN - 1 VIEW COMPARISON:  CT 12/03/2016, plain film 12/04/2016 FINDINGS: Gas within small bowel and colon, with interval improvement in degree of small bowel distention. No significant progression of retained enteric contrast within the proximal colon. Surgical changes  of the lumbar spine. IMPRESSION: Interval decrease degree of gaseous distention of small bowel, suggests resolving ileus/small bowel obstruction, however, there has been no further progression of retained enteric contrast within the proximal colon. Electronically Signed   By: Corrie Mckusick D.O.   On: 12/05/2016 08:36   Ct Abdomen Pelvis W Contrast  Result Date: 12/03/2016 CLINICAL DATA:  Generalized abdominal pain with burning for 2 weeks, history of diabetes mellitus, CHF, COPD, hypertension EXAM: CT ABDOMEN AND PELVIS WITH CONTRAST TECHNIQUE: Multidetector CT imaging of the abdomen and pelvis was performed using the standard protocol following bolus administration of intravenous contrast. Sagittal and coronal MPR images reconstructed from axial data set. CONTRAST:  138mL ISOVUE-300 IOPAMIDOL (ISOVUE-300) INJECTION 61% IV. Dilute oral contrast. COMPARISON:  None FINDINGS: Lower chest: Lung bases clear Hepatobiliary: Gallbladder and liver normal appearance Pancreas: Normal appearance Spleen: Normal appearance Adrenals/Urinary Tract: BILATERAL renal cysts largest upper pole LEFT kidney 3.6 x 3.3 cm image 20. Scattered renal vascular calcifications bilaterally. No hydronephrosis or hydroureter.  Bladder decompressed. Stomach/Bowel: Low-lying cecum in pelvis. Normal appendix anterior mid sacrum. Few dilated small bowel loops in the LEFT mid abdomen with decompressed distal small bowel loops consistent with small bowel obstruction. Transition from dilated to nondilated small bowel occurs in the upper LEFT pelvis best appreciated coronal image 58 where mild wall thickening is seen. This is nonspecific, could be due to inflammatory process, tumor or scarring. Colon decompressed and unremarkable. No definite gastric abnormalities the proximal stomach is collapsed and poorly assessed. Vascular/Lymphatic: Atherosclerotic calcifications aorta and iliac arteries without aneurysm. Few coronary artery calcifications incidentally noted. No definite adenopathy. Reproductive: Uterus surgically absent. Normal sized ovaries bilaterally. Other: Small amount of free pelvic fluid. Small umbilical hernia containing fat. No definite free air. Musculoskeletal: Prior lumbar fusion L4-S1. IMPRESSION: Mid small bowel obstruction with transition zone in the upper LEFT pelvis where wall bowel wall thickening is identified, question due to inflammatory process, scarring, or tumor. BILATERAL renal cysts. Small umbilical hernia containing fat. Scattered atherosclerotic calcifications as above. Aortic Atherosclerosis (ICD10-I70.0). Electronically Signed   By: Lavonia Dana M.D.   On: 12/03/2016 17:27   Dg Abd Portable 1v  Result Date: 12/06/2016 CLINICAL DATA:  Shortness of breath, constipation EXAM: PORTABLE ABDOMEN - 1 VIEW COMPARISON:  12/05/2016 FINDINGS: Contrast noted within nondistended colon. Mild gaseous distention of bowel, predominantly large bowel suggesting mild ileus. No free air organomegaly. Postoperative changes in the lower lumbar spine. IMPRESSION: Mild gaseous distention of bowel, predominantly colon, likely mild ileus. Oral contrast material noted within the transverse colon. Electronically Signed   By: Rolm Baptise M.D.   On: 12/06/2016 09:45   Dg Abd Portable 2v  Result Date: 12/04/2016 CLINICAL DATA:  Small bowel loops EXAM: PORTABLE ABDOMEN - 2 VIEW COMPARISON:  12/03/2016 FINDINGS: There is no free intraperitoneal gas on the upright view. Contrast has traversed into the colon. Distended loops of small and large bowel persist. Lumbar stabilization hardware remains in place. IMPRESSION: There is continued distention of both small and large bowel, however contrast has traversed into the colon excluding complete obstruction of small bowel. Findings may reflect ileus or partial small bowel obstruction. Electronically Signed   By: Marybelle Killings M.D.   On: 12/04/2016 10:37     Subjective: Seen this morning.  She is hopeful to the home today.  She reports that she tolerated her diet yesterday without any and has been passing flatus throughout the night.  Discharge Exam: Vitals:   12/07/16 1751 12/07/16 0258  BP: (!) 109/44 (!) 148/72  Pulse: 64 78  Resp: 18   Temp: (!) 97.5 F (36.4 C)   SpO2: 100%    Vitals:   12/06/16 2101 12/06/16 2349 12/07/16 0412 12/07/16 0826  BP:   (!) 109/44 (!) 148/72  Pulse: 67 75 64 78  Resp: 16 16 18    Temp:   (!) 97.5 F (36.4 C)   TempSrc:   Axillary   SpO2: 98% 98% 100%   Weight:      Height:        General: Pt is alert, awake, not in acute distress Cardiovascular: RRR, S1/S2 +, no rubs, no gallops Respiratory: CTA bilaterally, no wheezing, no rhonchi Abdominal: Soft, NT, ND, bowel sounds + Extremities: no edema, no cyanosis    The results of significant diagnostics from this hospitalization (including imaging, microbiology, ancillary and laboratory) are listed below for reference.     Microbiology: No results found for this or any previous visit (from the past 240 hour(s)).   Labs: BNP (last 3 results) No results for input(s): BNP in the last 8760 hours. Basic Metabolic Panel: Recent Labs  Lab 12/03/16 1348 12/03/16 2240 12/04/16 0559  12/05/16 0542 12/06/16 1620  NA 142 138 141 138 137  K 4.0 3.1* 4.0 3.7 4.1  CL 107 102 109 112* 108  CO2 27 28 25  20* 22  GLUCOSE 109* 101* 113* 126* 84  BUN 14 11 8  5* 5*  CREATININE 0.75 0.78 0.80 0.62 0.78  CALCIUM 10.0 10.0 9.2 9.0 9.2  MG  --  1.8  --   --   --    Liver Function Tests: Recent Labs  Lab 12/03/16 1348 12/03/16 2240 12/04/16 0559 12/05/16 0542  AST 16 18 15  13*  ALT 10* 10* 10* 9*  ALKPHOS 95 100 95 85  BILITOT 0.7 0.7 0.6 0.5  PROT 7.4 7.8 6.7 6.1*  ALBUMIN 4.6 4.9 4.2 3.9   Recent Labs  Lab 12/03/16 2240  LIPASE 28   No results for input(s): AMMONIA in the last 168 hours. CBC: Recent Labs  Lab 12/03/16 2240 12/04/16 0559 12/05/16 0542 12/06/16 0422 12/07/16 0635  WBC 6.8 6.3 5.1 4.5 3.9*  NEUTROABS 4.3 3.9 3.2 2.5 2.1  HGB 10.9* 10.1* 9.6* 9.2* 8.9*  HCT 36.7 34.2* 32.1* 30.8* 30.5*  MCV 85.0 85.7 84.9 84.6 85.2  PLT 252 213 217 210 199   Cardiac Enzymes: No results for input(s): CKTOTAL, CKMB, CKMBINDEX, TROPONINI in the last 168 hours. BNP: Invalid input(s): POCBNP CBG: Recent Labs  Lab 12/06/16 2026 12/06/16 2339 12/07/16 0411 12/07/16 0756 12/07/16 1145  GLUCAP 112* 97 89 82 92   D-Dimer No results for input(s): DDIMER in the last 72 hours. Hgb A1c No results for input(s): HGBA1C in the last 72 hours. Lipid Profile No results for input(s): CHOL, HDL, LDLCALC, TRIG, CHOLHDL, LDLDIRECT in the last 72 hours. Thyroid function studies No results for input(s): TSH, T4TOTAL, T3FREE, THYROIDAB in the last 72 hours.  Invalid input(s): FREET3 Anemia work up No results for input(s): VITAMINB12, FOLATE, FERRITIN, TIBC, IRON, RETICCTPCT in the last 72 hours. Urinalysis No results found for: COLORURINE, APPEARANCEUR, LABSPEC, [place], GLUCOSEU, HGBUR, BILIRUBINUR, KETONESUR, PROTEINUR, UROBILINOGEN, NITRITE, LEUKOCYTESUR Sepsis Labs Invalid input(s): PROCALCITONIN,  WBC,  LACTICIDVEN Microbiology No results found for this or  any previous visit (from the past 240 hour(s)).   Time coordinating discharge: 35 minutes  SIGNED:   Loretha Stapler, MD  [facility] 12/07/2016, 1:18 PM Pager [phone] If  7PM-7AM, please contact night-coverage [email or web address] Password [email or web address]

## 2016-12-07 NOTE — Progress Notes (Signed)
[facility] Progress Note     Subjective: Patient tolerating her soft diet without issues. Feeling good and no pain or nausea. She is passing gas but no BM yesterday. Has a history of constipation at baseline and takes Linzess.    Objective: Vital signs in last 24 hours: Temp:  [97.5 F (36.4 C)-98.8 F (37.1 C)] 97.5 F (36.4 C) (12/01 0412) Pulse Rate:  [64-78] 78 (12/01 0826) Resp:  [16-18] 18 (12/01 0412) BP: (109-148)/(44-72) 148/72 (12/01 0826) SpO2:  [98 %-100 %] 100 % (12/01 0412) Last BM Date: 12/05/16  Intake/Output from previous day: 11/30 0701 - 12/01 0700 In: 1040 [P.O.:240; I.V.:800] Out: -  Intake/Output this shift: Total I/O In: 240 [P.O.:240] Out: -   General appearance: alert, cooperative and no distress Resp: normal work breathing GI: soft, non-tender; bowel sounds normal; no masses,  no organomegaly Extremities: extremities normal, atraumatic, no cyanosis or edema  Lab Results:  Recent Labs    12/06/16 0422 12/07/16 0635  WBC 4.5 3.9*  HGB 9.2* 8.9*  HCT 30.8* 30.5*  PLT 210 199   BMET Recent Labs    12/05/16 0542 12/06/16 1620  NA 138 137  K 3.7 4.1  CL 112* 108  CO2 20* 22  GLUCOSE 126* 84  BUN 5* 5*  CREATININE 0.62 0.78  CALCIUM 9.0 9.2   PT/INR No results for input(s): LABPROT, INR in the last 72 hours.  Studies/Results: Dg Abd Portable 1v  Result Date: 12/06/2016 CLINICAL DATA:  Shortness of breath, constipation EXAM: PORTABLE ABDOMEN - 1 VIEW COMPARISON:  12/05/2016 FINDINGS: Contrast noted within nondistended colon. Mild gaseous distention of bowel, predominantly large bowel suggesting mild ileus. No free air organomegaly. Postoperative changes in the lower lumbar spine. IMPRESSION: Mild gaseous distention of bowel, predominantly colon, likely mild ileus. Oral contrast material noted within the transverse colon. Electronically Signed   By: Rolm Baptise M.D.   On: 12/06/2016 09:45   Assessment/Plan: Ms.  Bolton is a 71 yo with a SBO with a focal area of thickening of the small bowel causing a partial obstruction of unknown etiology; cancer versus inflammatory. Has had an extensive GI workup in the last several months with EGD, colonoscopy, capsule studies, and enterostomy with AVM argon beam cautery for anemia. -Tolerating soft diet, no pain or nausea  -Flatus but no BMs, Linzess ordered this AM, will see if has a BM shortly  -If has BM can go home, if does not have BM, will discuss with patient and make a plan   LOS: 3 days    Virl Cagey 12/07/2016

## 2016-12-07 NOTE — Progress Notes (Signed)
No BM. Passing gas. Ate breakfast and eating lunch. Ok to d/c and patient knows to call my office if no BM by Monday.  Plan to see in 2 weeks otherwise. If worsening pain, distention, or no BM will need to go to ED, and she expressed understanding.   Curlene Labrum, MD [facility] [address] [place], [place] [address] [phone] (office)

## 2016-12-09 ENCOUNTER — Ambulatory Visit: Payer: Medicare HMO | Admitting: Gastroenterology

## 2016-12-13 ENCOUNTER — Ambulatory Visit: Payer: Medicare HMO | Admitting: Gastroenterology

## 2018-02-03 IMAGING — CT CT ABD-PELV W/ CM
2 of 5 series · 16 of 46 positions shown, 18 images · IV contrast (Isovue)
Comparison: None

CLINICAL DATA: Generalized abdominal pain with burning for 2 weeks,
history of diabetes mellitus, CHF, COPD, hypertension

EXAM:
CT ABDOMEN AND PELVIS WITH CONTRAST
TECHNIQUE: Multidetector CT imaging of the abdomen and pelvis was performed
using the standard protocol following bolus administration of
intravenous contrast. Sagittal and coronal MPR images reconstructed
from axial data set.
CONTRAST:  100mL [id number] IOPAMIDOL ([id number]-[id number]) INJECTION 61% IV.
Dilute oral contrast.

[Series 2: axial st · axial · 0.77mm/px · z∈[-412,-37]mm · 13 of 85 slices shown, 15 images]
[im 5/85  soft-tissue]
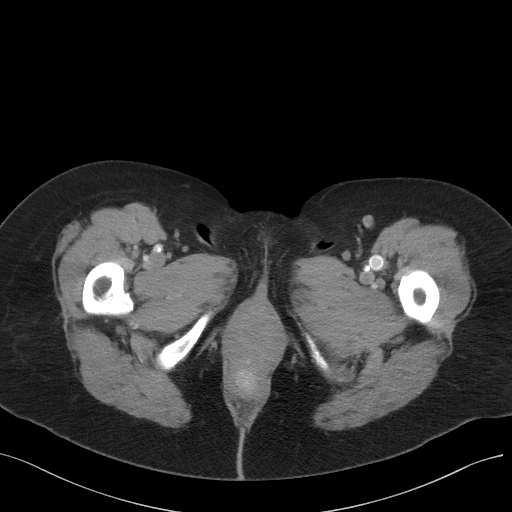
[im 5/85  bone]
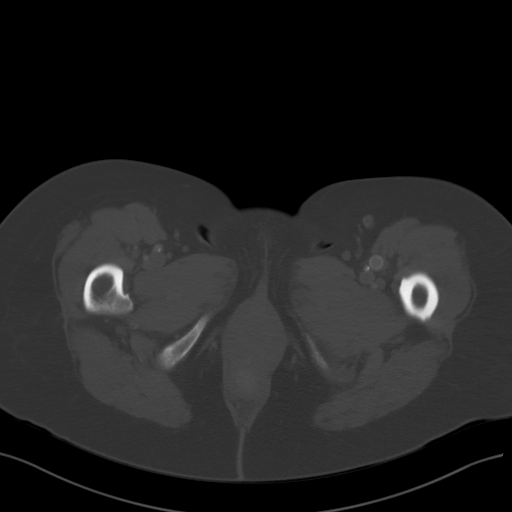
[im 10/85  soft-tissue]
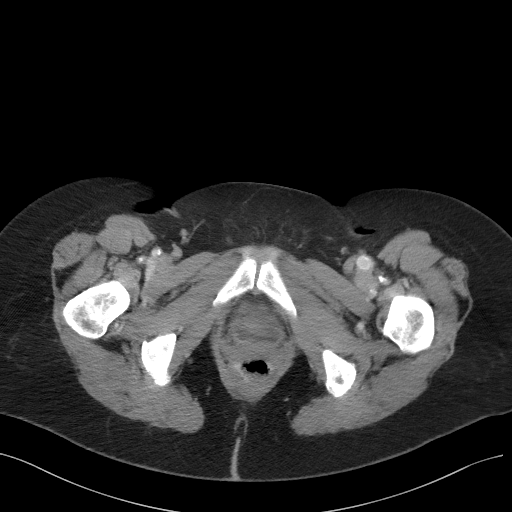
[im 20/85  soft-tissue]
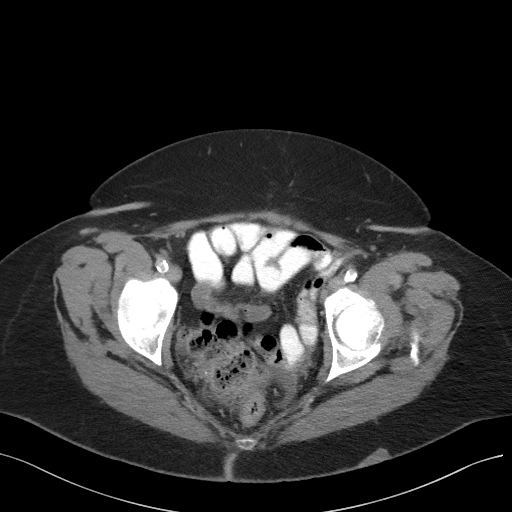
[im 25/85  soft-tissue]
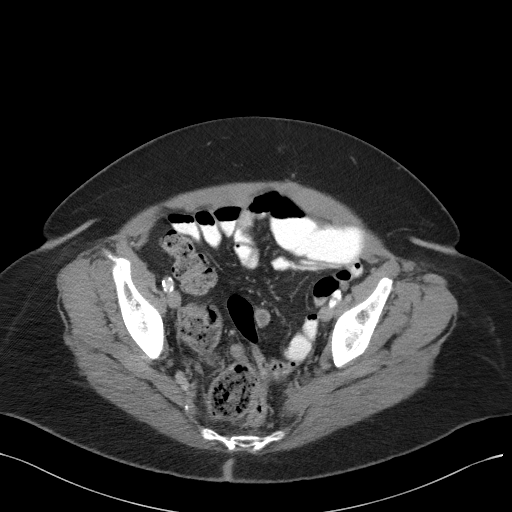
[im 30/85  soft-tissue]
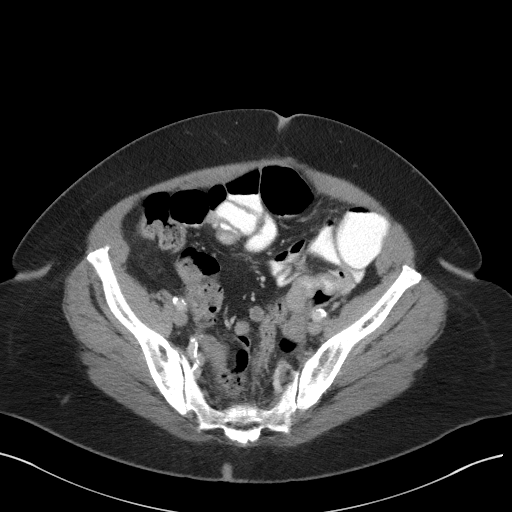
[im 35/85  soft-tissue]
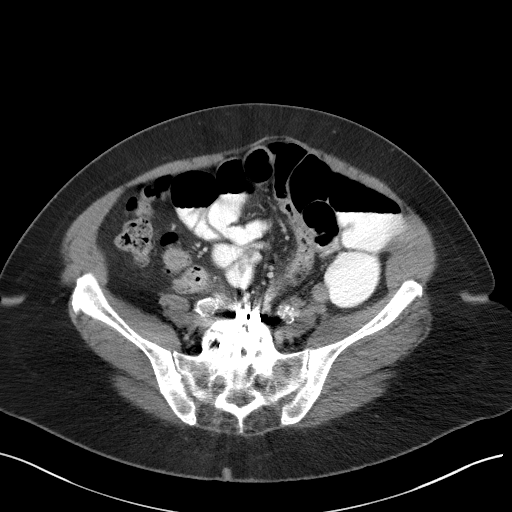
[im 45/85  soft-tissue]
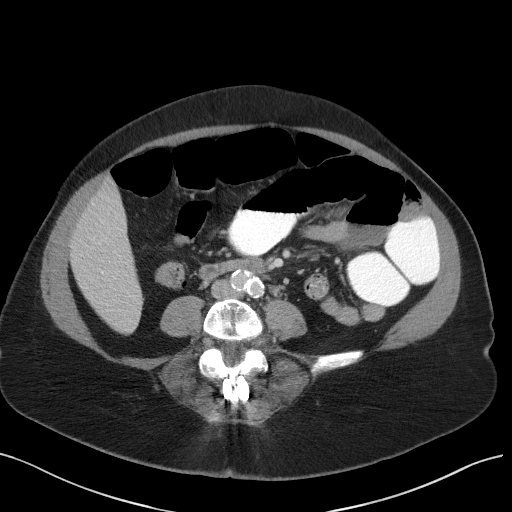
[im 50/85  soft-tissue]
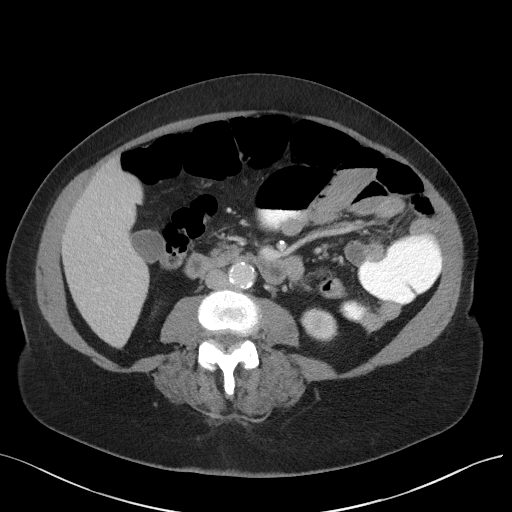
[im 55/85  soft-tissue]
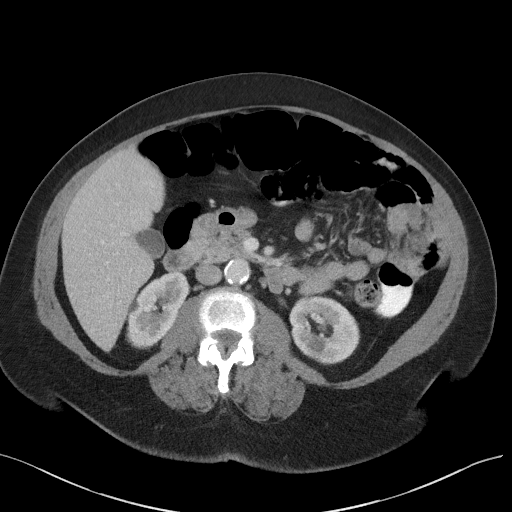
[im 55/85  bone]
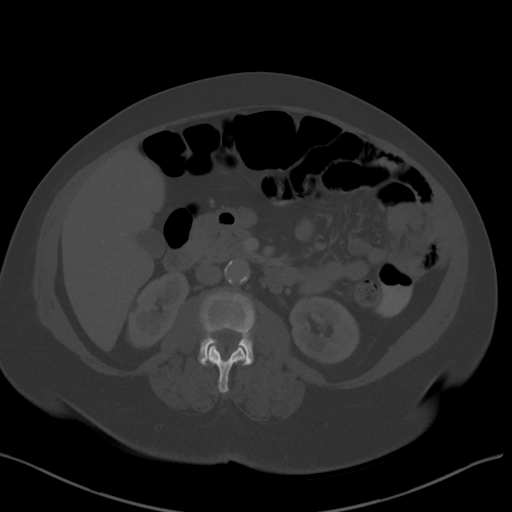
[im 60/85  soft-tissue]
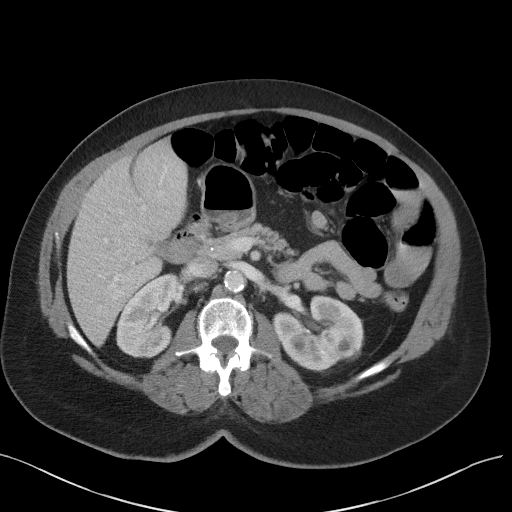
[im 65/85  soft-tissue]
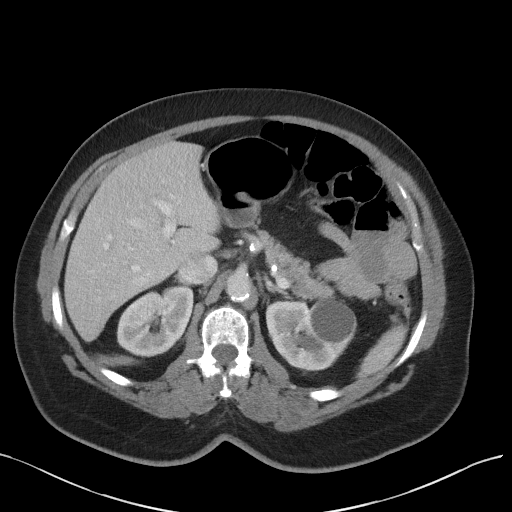
[im 75/85  soft-tissue]
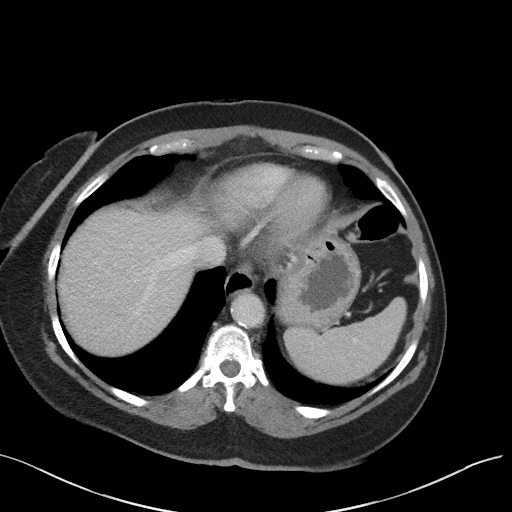
[im 80/85  soft-tissue]
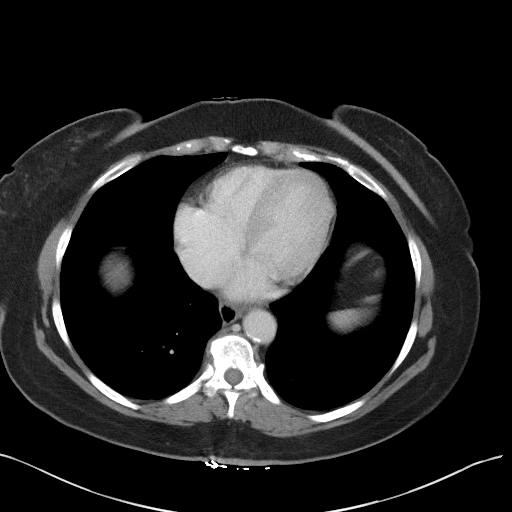

[Series 6: coronal st · coronal · 0.73mm/px · 3 of 107 slices shown]
[im 36/107  soft-tissue]
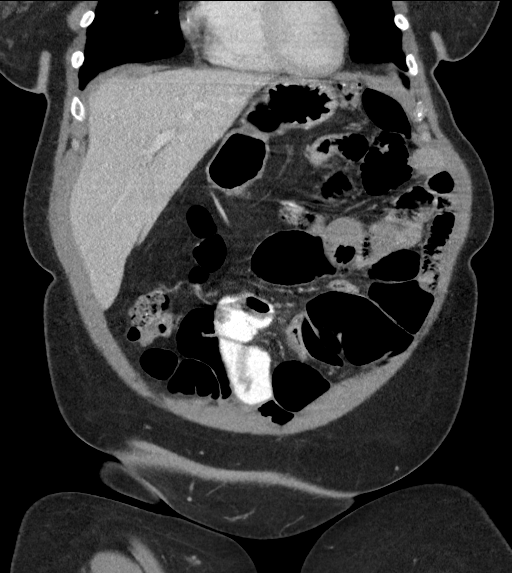
[im 48/107  soft-tissue]
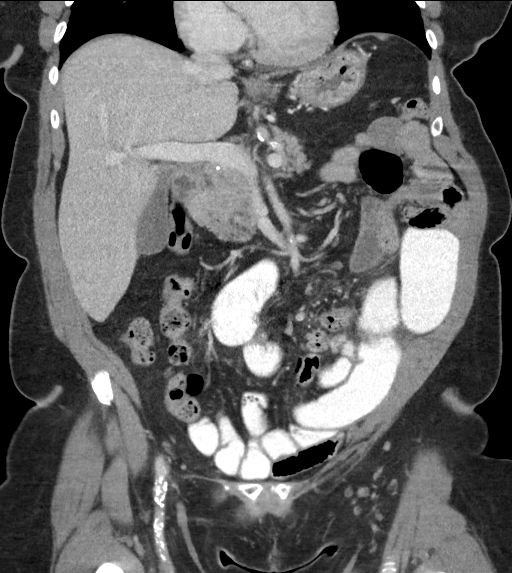
[im 59/107  soft-tissue]
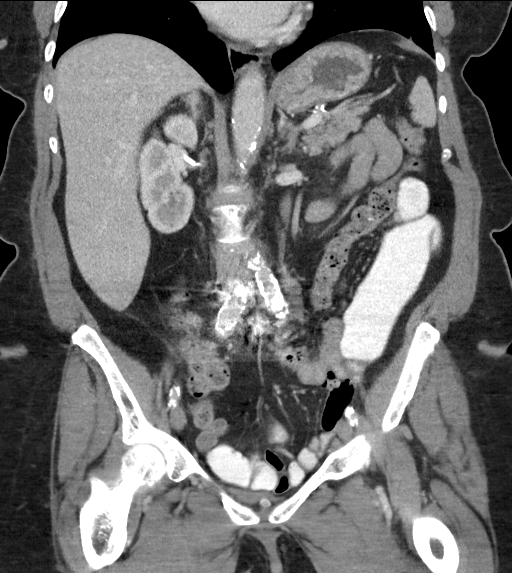

[16 of 46 positions shown; findings below may reference images not displayed]

FINDINGS: Lower chest: Lung bases clear

Hepatobiliary: Gallbladder and liver normal appearance

Pancreas: Normal appearance

Spleen: Normal appearance

Adrenals/Urinary Tract: BILATERAL renal cysts largest upper pole
LEFT kidney 3.6 x 3.3 cm image 20. Scattered renal vascular
calcifications bilaterally. No hydronephrosis or hydroureter.
Bladder decompressed.

Stomach/Bowel: Low-lying cecum in pelvis. Normal appendix anterior
mid sacrum. Few dilated small bowel loops in the LEFT mid abdomen
with decompressed distal small bowel loops consistent with small
bowel obstruction. Transition from dilated to nondilated small bowel
occurs in the upper LEFT pelvis best appreciated coronal image 58
where mild wall thickening is seen. This is nonspecific, could be
due to inflammatory process, tumor or scarring. Colon decompressed
and unremarkable. No definite gastric abnormalities the proximal
stomach is collapsed and poorly assessed.

Vascular/Lymphatic: Atherosclerotic calcifications aorta and iliac
arteries without aneurysm. Few coronary artery calcifications
incidentally noted. No definite adenopathy.

Reproductive: Uterus surgically absent. Normal sized ovaries
bilaterally.

Other: Small amount of free pelvic fluid. Small umbilical hernia
containing fat. No definite free air.

Musculoskeletal: Prior lumbar fusion L4-S1.
IMPRESSION: Mid small bowel obstruction with transition zone in the upper LEFT
pelvis where wall bowel wall thickening is identified, question due
to inflammatory process, scarring, or tumor.

BILATERAL renal cysts.

Small umbilical hernia containing fat.

Scattered atherosclerotic calcifications as above.

Aortic Atherosclerosis ([id number]).

## 2018-02-06 IMAGING — CR DG ABD PORTABLE 1V
1 series · 1 of 1 positions shown · non-contrast
Comparison: 12/05/2016

CLINICAL DATA: Shortness of breath, constipation

EXAM:
PORTABLE ABDOMEN - 1 VIEW

[supine ap]
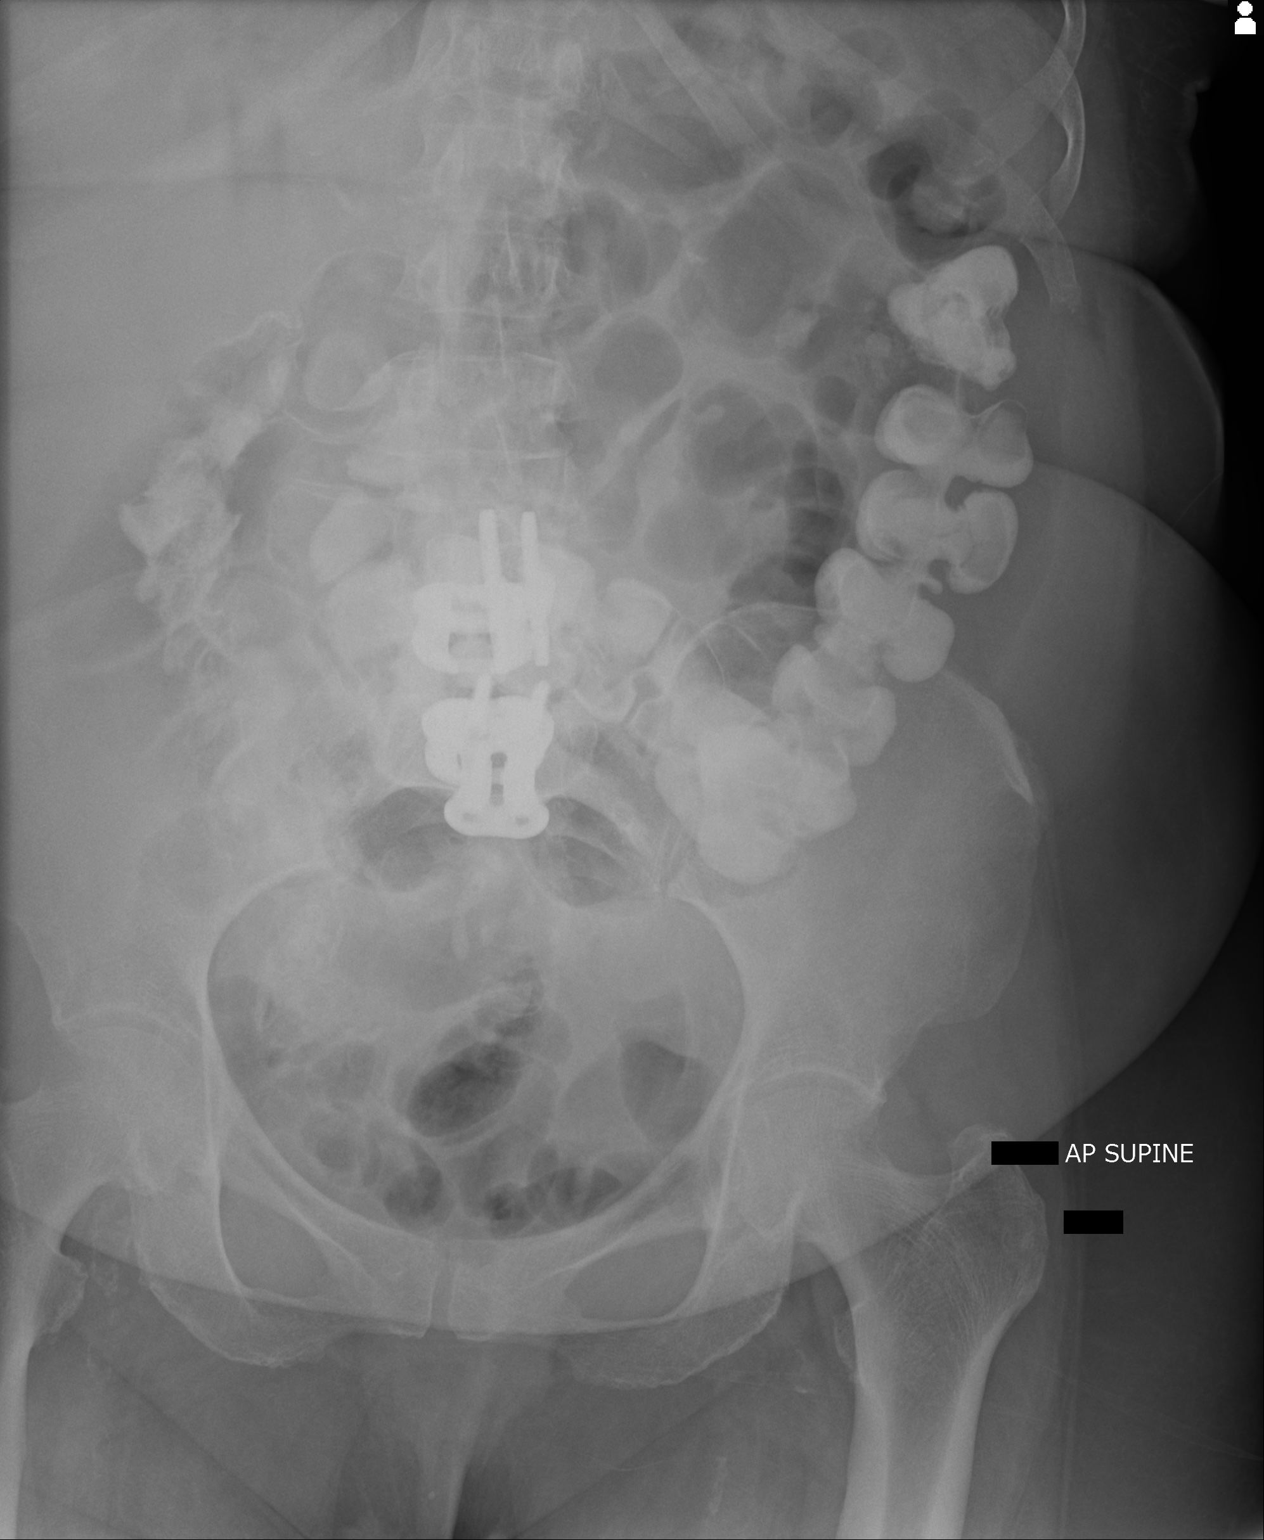

[1 of 1 positions shown; findings below may reference images not displayed]

FINDINGS: Contrast noted within nondistended colon. Mild gaseous distention of
bowel, predominantly large bowel suggesting mild ileus. No free air
organomegaly. Postoperative changes in the lower lumbar spine.
IMPRESSION: Mild gaseous distention of bowel, predominantly colon, likely mild
ileus. Oral contrast material noted within the transverse colon.

## 2021-07-17 ENCOUNTER — Encounter: Payer: Self-pay | Admitting: *Deleted

## 2021-07-17 ENCOUNTER — Encounter (INDEPENDENT_AMBULATORY_CARE_PROVIDER_SITE_OTHER): Payer: Self-pay | Admitting: *Deleted
